# Patient Record
Sex: Female | Born: 1955
Health system: Southern US, Community
[De-identification: ages and names within clinical notes are randomized; demographics above are authoritative.]

## PROBLEM LIST (undated history)

## (undated) DIAGNOSIS — J449 Chronic obstructive pulmonary disease, unspecified: Secondary | ICD-10-CM

## (undated) DIAGNOSIS — I10 Essential (primary) hypertension: Secondary | ICD-10-CM

## (undated) DIAGNOSIS — G4730 Sleep apnea, unspecified: Secondary | ICD-10-CM

## (undated) HISTORY — PX: ABDOMINAL HYSTERECTOMY: SHX81

## (undated) HISTORY — PX: CHOLECYSTECTOMY: SHX55

## (undated) HISTORY — PX: APPENDECTOMY: SHX54

## (undated) HISTORY — PX: TONSILLECTOMY: SUR1361

## (undated) HISTORY — PX: HERNIA REPAIR: SHX51

---

## 2011-06-09 DIAGNOSIS — F331 Major depressive disorder, recurrent, moderate: Secondary | ICD-10-CM | POA: Diagnosis not present

## 2011-08-12 DIAGNOSIS — R51 Headache: Secondary | ICD-10-CM | POA: Diagnosis not present

## 2011-09-08 DIAGNOSIS — M25569 Pain in unspecified knee: Secondary | ICD-10-CM | POA: Diagnosis not present

## 2011-09-08 DIAGNOSIS — E559 Vitamin D deficiency, unspecified: Secondary | ICD-10-CM | POA: Diagnosis not present

## 2011-09-08 DIAGNOSIS — I10 Essential (primary) hypertension: Secondary | ICD-10-CM | POA: Diagnosis not present

## 2011-09-08 DIAGNOSIS — E119 Type 2 diabetes mellitus without complications: Secondary | ICD-10-CM | POA: Diagnosis not present

## 2011-09-08 DIAGNOSIS — F331 Major depressive disorder, recurrent, moderate: Secondary | ICD-10-CM | POA: Diagnosis not present

## 2011-09-08 DIAGNOSIS — E669 Obesity, unspecified: Secondary | ICD-10-CM | POA: Diagnosis not present

## 2011-09-10 DIAGNOSIS — M25569 Pain in unspecified knee: Secondary | ICD-10-CM | POA: Diagnosis not present

## 2011-09-10 DIAGNOSIS — R937 Abnormal findings on diagnostic imaging of other parts of musculoskeletal system: Secondary | ICD-10-CM | POA: Diagnosis not present

## 2011-10-08 DIAGNOSIS — H66009 Acute suppurative otitis media without spontaneous rupture of ear drum, unspecified ear: Secondary | ICD-10-CM | POA: Diagnosis not present

## 2011-10-08 DIAGNOSIS — H1030 Unspecified acute conjunctivitis, unspecified eye: Secondary | ICD-10-CM | POA: Diagnosis not present

## 2011-10-08 DIAGNOSIS — J0100 Acute maxillary sinusitis, unspecified: Secondary | ICD-10-CM | POA: Diagnosis not present

## 2011-11-16 DIAGNOSIS — N318 Other neuromuscular dysfunction of bladder: Secondary | ICD-10-CM | POA: Diagnosis not present

## 2011-11-16 DIAGNOSIS — J40 Bronchitis, not specified as acute or chronic: Secondary | ICD-10-CM | POA: Diagnosis not present

## 2011-11-16 DIAGNOSIS — J309 Allergic rhinitis, unspecified: Secondary | ICD-10-CM | POA: Diagnosis not present

## 2011-11-16 DIAGNOSIS — J45909 Unspecified asthma, uncomplicated: Secondary | ICD-10-CM | POA: Diagnosis not present

## 2011-12-03 DIAGNOSIS — F331 Major depressive disorder, recurrent, moderate: Secondary | ICD-10-CM | POA: Diagnosis not present

## 2011-12-17 DIAGNOSIS — N10 Acute tubulo-interstitial nephritis: Secondary | ICD-10-CM | POA: Diagnosis not present

## 2011-12-17 DIAGNOSIS — N3000 Acute cystitis without hematuria: Secondary | ICD-10-CM | POA: Diagnosis not present

## 2011-12-17 DIAGNOSIS — E119 Type 2 diabetes mellitus without complications: Secondary | ICD-10-CM | POA: Diagnosis not present

## 2012-02-25 DIAGNOSIS — F331 Major depressive disorder, recurrent, moderate: Secondary | ICD-10-CM | POA: Diagnosis not present

## 2012-03-17 DIAGNOSIS — J209 Acute bronchitis, unspecified: Secondary | ICD-10-CM | POA: Diagnosis not present

## 2012-03-26 DIAGNOSIS — J019 Acute sinusitis, unspecified: Secondary | ICD-10-CM | POA: Diagnosis not present

## 2012-03-26 DIAGNOSIS — L0291 Cutaneous abscess, unspecified: Secondary | ICD-10-CM | POA: Diagnosis not present

## 2012-03-26 DIAGNOSIS — L0390 Cellulitis, unspecified: Secondary | ICD-10-CM | POA: Diagnosis not present

## 2012-03-26 DIAGNOSIS — J209 Acute bronchitis, unspecified: Secondary | ICD-10-CM | POA: Diagnosis not present

## 2012-07-12 DIAGNOSIS — F331 Major depressive disorder, recurrent, moderate: Secondary | ICD-10-CM | POA: Diagnosis not present

## 2012-07-18 DIAGNOSIS — L02419 Cutaneous abscess of limb, unspecified: Secondary | ICD-10-CM | POA: Diagnosis not present

## 2012-07-18 DIAGNOSIS — M25569 Pain in unspecified knee: Secondary | ICD-10-CM | POA: Diagnosis not present

## 2012-07-18 DIAGNOSIS — L03119 Cellulitis of unspecified part of limb: Secondary | ICD-10-CM | POA: Diagnosis not present

## 2012-07-18 DIAGNOSIS — E1101 Type 2 diabetes mellitus with hyperosmolarity with coma: Secondary | ICD-10-CM | POA: Diagnosis not present

## 2012-10-20 DIAGNOSIS — N3000 Acute cystitis without hematuria: Secondary | ICD-10-CM | POA: Diagnosis not present

## 2012-10-20 DIAGNOSIS — N390 Urinary tract infection, site not specified: Secondary | ICD-10-CM | POA: Diagnosis not present

## 2012-11-13 DIAGNOSIS — R300 Dysuria: Secondary | ICD-10-CM | POA: Diagnosis not present

## 2012-11-13 DIAGNOSIS — L989 Disorder of the skin and subcutaneous tissue, unspecified: Secondary | ICD-10-CM | POA: Diagnosis not present

## 2012-11-13 DIAGNOSIS — M545 Low back pain: Secondary | ICD-10-CM | POA: Diagnosis not present

## 2012-12-20 DIAGNOSIS — F331 Major depressive disorder, recurrent, moderate: Secondary | ICD-10-CM | POA: Diagnosis not present

## 2013-03-06 DIAGNOSIS — J189 Pneumonia, unspecified organism: Secondary | ICD-10-CM | POA: Diagnosis not present

## 2013-03-06 DIAGNOSIS — R509 Fever, unspecified: Secondary | ICD-10-CM | POA: Diagnosis not present

## 2013-03-06 DIAGNOSIS — N3090 Cystitis, unspecified without hematuria: Secondary | ICD-10-CM | POA: Diagnosis not present

## 2013-03-06 DIAGNOSIS — L0291 Cutaneous abscess, unspecified: Secondary | ICD-10-CM | POA: Diagnosis not present

## 2013-03-06 DIAGNOSIS — N3000 Acute cystitis without hematuria: Secondary | ICD-10-CM | POA: Diagnosis not present

## 2013-03-14 DIAGNOSIS — J189 Pneumonia, unspecified organism: Secondary | ICD-10-CM | POA: Diagnosis not present

## 2013-03-14 DIAGNOSIS — F331 Major depressive disorder, recurrent, moderate: Secondary | ICD-10-CM | POA: Diagnosis not present

## 2013-04-18 DIAGNOSIS — K219 Gastro-esophageal reflux disease without esophagitis: Secondary | ICD-10-CM | POA: Diagnosis not present

## 2013-04-18 DIAGNOSIS — E785 Hyperlipidemia, unspecified: Secondary | ICD-10-CM | POA: Diagnosis not present

## 2013-04-18 DIAGNOSIS — I10 Essential (primary) hypertension: Secondary | ICD-10-CM | POA: Diagnosis not present

## 2013-04-18 DIAGNOSIS — E1149 Type 2 diabetes mellitus with other diabetic neurological complication: Secondary | ICD-10-CM | POA: Diagnosis not present

## 2013-05-11 DIAGNOSIS — J069 Acute upper respiratory infection, unspecified: Secondary | ICD-10-CM | POA: Diagnosis not present

## 2013-05-11 DIAGNOSIS — J209 Acute bronchitis, unspecified: Secondary | ICD-10-CM | POA: Diagnosis not present

## 2013-07-18 DIAGNOSIS — F331 Major depressive disorder, recurrent, moderate: Secondary | ICD-10-CM | POA: Diagnosis not present

## 2013-08-01 DIAGNOSIS — R32 Unspecified urinary incontinence: Secondary | ICD-10-CM | POA: Diagnosis not present

## 2013-08-01 DIAGNOSIS — M549 Dorsalgia, unspecified: Secondary | ICD-10-CM | POA: Diagnosis not present

## 2013-09-29 DIAGNOSIS — J9600 Acute respiratory failure, unspecified whether with hypoxia or hypercapnia: Secondary | ICD-10-CM | POA: Diagnosis not present

## 2013-09-29 DIAGNOSIS — J9620 Acute and chronic respiratory failure, unspecified whether with hypoxia or hypercapnia: Secondary | ICD-10-CM | POA: Diagnosis not present

## 2013-09-29 DIAGNOSIS — R05 Cough: Secondary | ICD-10-CM | POA: Diagnosis not present

## 2013-09-29 DIAGNOSIS — E7800 Pure hypercholesterolemia, unspecified: Secondary | ICD-10-CM | POA: Diagnosis present

## 2013-09-29 DIAGNOSIS — I10 Essential (primary) hypertension: Secondary | ICD-10-CM | POA: Diagnosis present

## 2013-09-29 DIAGNOSIS — J189 Pneumonia, unspecified organism: Secondary | ICD-10-CM | POA: Diagnosis not present

## 2013-09-29 DIAGNOSIS — G4733 Obstructive sleep apnea (adult) (pediatric): Secondary | ICD-10-CM | POA: Diagnosis present

## 2013-09-29 DIAGNOSIS — J45901 Unspecified asthma with (acute) exacerbation: Secondary | ICD-10-CM | POA: Diagnosis not present

## 2013-09-29 DIAGNOSIS — Z6841 Body Mass Index (BMI) 40.0 and over, adult: Secondary | ICD-10-CM | POA: Diagnosis not present

## 2013-09-29 DIAGNOSIS — F329 Major depressive disorder, single episode, unspecified: Secondary | ICD-10-CM | POA: Diagnosis present

## 2013-09-29 DIAGNOSIS — R0602 Shortness of breath: Secondary | ICD-10-CM | POA: Diagnosis not present

## 2013-09-29 DIAGNOSIS — R059 Cough, unspecified: Secondary | ICD-10-CM | POA: Diagnosis not present

## 2013-09-29 DIAGNOSIS — E1149 Type 2 diabetes mellitus with other diabetic neurological complication: Secondary | ICD-10-CM | POA: Diagnosis present

## 2013-09-29 DIAGNOSIS — R0902 Hypoxemia: Secondary | ICD-10-CM | POA: Diagnosis not present

## 2013-09-29 DIAGNOSIS — J440 Chronic obstructive pulmonary disease with acute lower respiratory infection: Secondary | ICD-10-CM | POA: Diagnosis not present

## 2013-09-29 DIAGNOSIS — J441 Chronic obstructive pulmonary disease with (acute) exacerbation: Secondary | ICD-10-CM | POA: Diagnosis not present

## 2013-09-29 DIAGNOSIS — J45909 Unspecified asthma, uncomplicated: Secondary | ICD-10-CM | POA: Diagnosis not present

## 2013-09-29 DIAGNOSIS — J984 Other disorders of lung: Secondary | ICD-10-CM | POA: Diagnosis not present

## 2013-09-29 DIAGNOSIS — F3289 Other specified depressive episodes: Secondary | ICD-10-CM | POA: Diagnosis present

## 2013-09-29 DIAGNOSIS — E662 Morbid (severe) obesity with alveolar hypoventilation: Secondary | ICD-10-CM | POA: Diagnosis not present

## 2013-09-29 DIAGNOSIS — J180 Bronchopneumonia, unspecified organism: Secondary | ICD-10-CM | POA: Diagnosis not present

## 2013-09-29 DIAGNOSIS — Z79899 Other long term (current) drug therapy: Secondary | ICD-10-CM | POA: Diagnosis not present

## 2013-09-29 DIAGNOSIS — K219 Gastro-esophageal reflux disease without esophagitis: Secondary | ICD-10-CM | POA: Diagnosis not present

## 2013-09-29 DIAGNOSIS — J209 Acute bronchitis, unspecified: Secondary | ICD-10-CM | POA: Diagnosis not present

## 2013-09-29 DIAGNOSIS — E1142 Type 2 diabetes mellitus with diabetic polyneuropathy: Secondary | ICD-10-CM | POA: Diagnosis present

## 2013-10-05 DIAGNOSIS — R5381 Other malaise: Secondary | ICD-10-CM | POA: Diagnosis not present

## 2013-10-05 DIAGNOSIS — R0609 Other forms of dyspnea: Secondary | ICD-10-CM | POA: Diagnosis not present

## 2013-10-05 DIAGNOSIS — G4710 Hypersomnia, unspecified: Secondary | ICD-10-CM | POA: Diagnosis not present

## 2013-10-05 DIAGNOSIS — R5383 Other fatigue: Secondary | ICD-10-CM | POA: Diagnosis not present

## 2013-10-05 DIAGNOSIS — G4730 Sleep apnea, unspecified: Secondary | ICD-10-CM | POA: Diagnosis not present

## 2013-10-05 DIAGNOSIS — J309 Allergic rhinitis, unspecified: Secondary | ICD-10-CM | POA: Diagnosis not present

## 2013-10-05 DIAGNOSIS — E559 Vitamin D deficiency, unspecified: Secondary | ICD-10-CM | POA: Diagnosis not present

## 2013-10-10 DIAGNOSIS — F331 Major depressive disorder, recurrent, moderate: Secondary | ICD-10-CM | POA: Diagnosis not present

## 2013-10-19 DIAGNOSIS — M545 Low back pain, unspecified: Secondary | ICD-10-CM | POA: Diagnosis not present

## 2013-10-19 DIAGNOSIS — G4733 Obstructive sleep apnea (adult) (pediatric): Secondary | ICD-10-CM | POA: Diagnosis not present

## 2013-10-19 DIAGNOSIS — J45909 Unspecified asthma, uncomplicated: Secondary | ICD-10-CM | POA: Diagnosis not present

## 2013-10-28 DIAGNOSIS — K047 Periapical abscess without sinus: Secondary | ICD-10-CM | POA: Diagnosis not present

## 2013-10-28 DIAGNOSIS — F17200 Nicotine dependence, unspecified, uncomplicated: Secondary | ICD-10-CM | POA: Diagnosis not present

## 2013-10-28 DIAGNOSIS — I10 Essential (primary) hypertension: Secondary | ICD-10-CM | POA: Diagnosis not present

## 2013-10-28 DIAGNOSIS — E119 Type 2 diabetes mellitus without complications: Secondary | ICD-10-CM | POA: Diagnosis not present

## 2013-10-28 DIAGNOSIS — K089 Disorder of teeth and supporting structures, unspecified: Secondary | ICD-10-CM | POA: Diagnosis not present

## 2013-10-28 DIAGNOSIS — K05219 Aggressive periodontitis, localized, unspecified severity: Secondary | ICD-10-CM | POA: Diagnosis not present

## 2013-10-28 DIAGNOSIS — Z79899 Other long term (current) drug therapy: Secondary | ICD-10-CM | POA: Diagnosis not present

## 2013-11-01 DIAGNOSIS — J9620 Acute and chronic respiratory failure, unspecified whether with hypoxia or hypercapnia: Secondary | ICD-10-CM | POA: Diagnosis not present

## 2014-01-30 DIAGNOSIS — F331 Major depressive disorder, recurrent, moderate: Secondary | ICD-10-CM | POA: Diagnosis not present

## 2014-02-15 DIAGNOSIS — N3281 Overactive bladder: Secondary | ICD-10-CM | POA: Diagnosis not present

## 2014-02-15 DIAGNOSIS — I10 Essential (primary) hypertension: Secondary | ICD-10-CM | POA: Diagnosis not present

## 2014-02-15 DIAGNOSIS — L03115 Cellulitis of right lower limb: Secondary | ICD-10-CM | POA: Diagnosis not present

## 2014-02-15 DIAGNOSIS — M25561 Pain in right knee: Secondary | ICD-10-CM | POA: Diagnosis not present

## 2014-02-15 DIAGNOSIS — Z23 Encounter for immunization: Secondary | ICD-10-CM | POA: Diagnosis not present

## 2014-02-15 DIAGNOSIS — R32 Unspecified urinary incontinence: Secondary | ICD-10-CM | POA: Diagnosis not present

## 2014-02-15 DIAGNOSIS — E1140 Type 2 diabetes mellitus with diabetic neuropathy, unspecified: Secondary | ICD-10-CM | POA: Diagnosis not present

## 2014-02-15 DIAGNOSIS — E785 Hyperlipidemia, unspecified: Secondary | ICD-10-CM | POA: Diagnosis not present

## 2014-02-15 DIAGNOSIS — S8991XA Unspecified injury of right lower leg, initial encounter: Secondary | ICD-10-CM | POA: Diagnosis not present

## 2014-02-22 DIAGNOSIS — R0602 Shortness of breath: Secondary | ICD-10-CM | POA: Diagnosis not present

## 2014-02-22 DIAGNOSIS — Z720 Tobacco use: Secondary | ICD-10-CM | POA: Diagnosis not present

## 2014-02-22 DIAGNOSIS — R42 Dizziness and giddiness: Secondary | ICD-10-CM | POA: Diagnosis not present

## 2014-02-22 DIAGNOSIS — L03115 Cellulitis of right lower limb: Secondary | ICD-10-CM | POA: Diagnosis not present

## 2014-02-22 DIAGNOSIS — R2241 Localized swelling, mass and lump, right lower limb: Secondary | ICD-10-CM | POA: Diagnosis not present

## 2014-02-22 DIAGNOSIS — Z792 Long term (current) use of antibiotics: Secondary | ICD-10-CM | POA: Diagnosis not present

## 2014-02-22 DIAGNOSIS — S0990XA Unspecified injury of head, initial encounter: Secondary | ICD-10-CM | POA: Diagnosis not present

## 2014-02-22 DIAGNOSIS — J45909 Unspecified asthma, uncomplicated: Secondary | ICD-10-CM | POA: Diagnosis not present

## 2014-02-22 DIAGNOSIS — E780 Pure hypercholesterolemia: Secondary | ICD-10-CM | POA: Diagnosis not present

## 2014-02-22 DIAGNOSIS — J189 Pneumonia, unspecified organism: Secondary | ICD-10-CM | POA: Diagnosis not present

## 2014-02-22 DIAGNOSIS — R918 Other nonspecific abnormal finding of lung field: Secondary | ICD-10-CM | POA: Diagnosis not present

## 2014-02-22 DIAGNOSIS — E118 Type 2 diabetes mellitus with unspecified complications: Secondary | ICD-10-CM | POA: Diagnosis not present

## 2014-02-22 DIAGNOSIS — M79604 Pain in right leg: Secondary | ICD-10-CM | POA: Diagnosis not present

## 2014-02-22 DIAGNOSIS — I10 Essential (primary) hypertension: Secondary | ICD-10-CM | POA: Diagnosis not present

## 2014-03-26 DIAGNOSIS — N3281 Overactive bladder: Secondary | ICD-10-CM | POA: Diagnosis not present

## 2014-03-26 DIAGNOSIS — N3090 Cystitis, unspecified without hematuria: Secondary | ICD-10-CM | POA: Diagnosis not present

## 2014-03-30 DIAGNOSIS — R918 Other nonspecific abnormal finding of lung field: Secondary | ICD-10-CM | POA: Diagnosis not present

## 2014-03-30 DIAGNOSIS — R509 Fever, unspecified: Secondary | ICD-10-CM | POA: Diagnosis not present

## 2014-03-30 DIAGNOSIS — J189 Pneumonia, unspecified organism: Secondary | ICD-10-CM | POA: Diagnosis not present

## 2014-03-30 DIAGNOSIS — N390 Urinary tract infection, site not specified: Secondary | ICD-10-CM | POA: Diagnosis not present

## 2014-03-30 DIAGNOSIS — J9601 Acute respiratory failure with hypoxia: Secondary | ICD-10-CM | POA: Diagnosis not present

## 2014-03-30 DIAGNOSIS — R0989 Other specified symptoms and signs involving the circulatory and respiratory systems: Secondary | ICD-10-CM | POA: Diagnosis not present

## 2014-03-31 DIAGNOSIS — Z885 Allergy status to narcotic agent status: Secondary | ICD-10-CM | POA: Diagnosis not present

## 2014-03-31 DIAGNOSIS — R51 Headache: Secondary | ICD-10-CM | POA: Diagnosis present

## 2014-03-31 DIAGNOSIS — J9600 Acute respiratory failure, unspecified whether with hypoxia or hypercapnia: Secondary | ICD-10-CM | POA: Diagnosis not present

## 2014-03-31 DIAGNOSIS — I10 Essential (primary) hypertension: Secondary | ICD-10-CM | POA: Diagnosis present

## 2014-03-31 DIAGNOSIS — J9601 Acute respiratory failure with hypoxia: Secondary | ICD-10-CM | POA: Diagnosis not present

## 2014-03-31 DIAGNOSIS — R0989 Other specified symptoms and signs involving the circulatory and respiratory systems: Secondary | ICD-10-CM | POA: Diagnosis not present

## 2014-03-31 DIAGNOSIS — N390 Urinary tract infection, site not specified: Secondary | ICD-10-CM | POA: Diagnosis not present

## 2014-03-31 DIAGNOSIS — J189 Pneumonia, unspecified organism: Secondary | ICD-10-CM | POA: Diagnosis not present

## 2014-03-31 DIAGNOSIS — F17200 Nicotine dependence, unspecified, uncomplicated: Secondary | ICD-10-CM | POA: Diagnosis present

## 2014-03-31 DIAGNOSIS — K219 Gastro-esophageal reflux disease without esophagitis: Secondary | ICD-10-CM | POA: Diagnosis present

## 2014-03-31 DIAGNOSIS — D649 Anemia, unspecified: Secondary | ICD-10-CM | POA: Diagnosis present

## 2014-03-31 DIAGNOSIS — E780 Pure hypercholesterolemia: Secondary | ICD-10-CM | POA: Diagnosis present

## 2014-03-31 DIAGNOSIS — B9620 Unspecified Escherichia coli [E. coli] as the cause of diseases classified elsewhere: Secondary | ICD-10-CM | POA: Diagnosis present

## 2014-03-31 DIAGNOSIS — R509 Fever, unspecified: Secondary | ICD-10-CM | POA: Diagnosis not present

## 2014-03-31 DIAGNOSIS — E1140 Type 2 diabetes mellitus with diabetic neuropathy, unspecified: Secondary | ICD-10-CM | POA: Diagnosis not present

## 2014-03-31 DIAGNOSIS — G4733 Obstructive sleep apnea (adult) (pediatric): Secondary | ICD-10-CM | POA: Diagnosis present

## 2014-03-31 DIAGNOSIS — J45901 Unspecified asthma with (acute) exacerbation: Secondary | ICD-10-CM | POA: Diagnosis not present

## 2014-03-31 DIAGNOSIS — R918 Other nonspecific abnormal finding of lung field: Secondary | ICD-10-CM | POA: Diagnosis not present

## 2014-03-31 DIAGNOSIS — F419 Anxiety disorder, unspecified: Secondary | ICD-10-CM | POA: Diagnosis present

## 2014-03-31 DIAGNOSIS — J441 Chronic obstructive pulmonary disease with (acute) exacerbation: Secondary | ICD-10-CM | POA: Diagnosis not present

## 2014-03-31 DIAGNOSIS — J168 Pneumonia due to other specified infectious organisms: Secondary | ICD-10-CM | POA: Diagnosis not present

## 2014-03-31 DIAGNOSIS — M1990 Unspecified osteoarthritis, unspecified site: Secondary | ICD-10-CM | POA: Diagnosis present

## 2014-03-31 DIAGNOSIS — F319 Bipolar disorder, unspecified: Secondary | ICD-10-CM | POA: Diagnosis present

## 2014-03-31 DIAGNOSIS — Z881 Allergy status to other antibiotic agents status: Secondary | ICD-10-CM | POA: Diagnosis not present

## 2014-03-31 DIAGNOSIS — Z91040 Latex allergy status: Secondary | ICD-10-CM | POA: Diagnosis not present

## 2014-04-18 DIAGNOSIS — Z7952 Long term (current) use of systemic steroids: Secondary | ICD-10-CM | POA: Diagnosis not present

## 2014-04-18 DIAGNOSIS — Z720 Tobacco use: Secondary | ICD-10-CM | POA: Diagnosis not present

## 2014-04-18 DIAGNOSIS — I10 Essential (primary) hypertension: Secondary | ICD-10-CM | POA: Diagnosis not present

## 2014-04-18 DIAGNOSIS — R0789 Other chest pain: Secondary | ICD-10-CM | POA: Diagnosis not present

## 2014-04-18 DIAGNOSIS — R531 Weakness: Secondary | ICD-10-CM | POA: Diagnosis not present

## 2014-04-18 DIAGNOSIS — J441 Chronic obstructive pulmonary disease with (acute) exacerbation: Secondary | ICD-10-CM | POA: Diagnosis not present

## 2014-04-18 DIAGNOSIS — J45909 Unspecified asthma, uncomplicated: Secondary | ICD-10-CM | POA: Diagnosis not present

## 2014-04-18 DIAGNOSIS — R079 Chest pain, unspecified: Secondary | ICD-10-CM | POA: Diagnosis not present

## 2014-04-18 DIAGNOSIS — J45901 Unspecified asthma with (acute) exacerbation: Secondary | ICD-10-CM | POA: Diagnosis not present

## 2014-04-18 DIAGNOSIS — E780 Pure hypercholesterolemia: Secondary | ICD-10-CM | POA: Diagnosis not present

## 2014-04-18 DIAGNOSIS — R05 Cough: Secondary | ICD-10-CM | POA: Diagnosis not present

## 2014-04-18 DIAGNOSIS — E119 Type 2 diabetes mellitus without complications: Secondary | ICD-10-CM | POA: Diagnosis not present

## 2014-05-30 DIAGNOSIS — F331 Major depressive disorder, recurrent, moderate: Secondary | ICD-10-CM | POA: Diagnosis not present

## 2014-06-08 DIAGNOSIS — I10 Essential (primary) hypertension: Secondary | ICD-10-CM | POA: Diagnosis not present

## 2014-06-08 DIAGNOSIS — R05 Cough: Secondary | ICD-10-CM | POA: Diagnosis not present

## 2014-06-08 DIAGNOSIS — N390 Urinary tract infection, site not specified: Secondary | ICD-10-CM | POA: Diagnosis not present

## 2014-06-08 DIAGNOSIS — E118 Type 2 diabetes mellitus with unspecified complications: Secondary | ICD-10-CM | POA: Diagnosis not present

## 2014-06-08 DIAGNOSIS — R079 Chest pain, unspecified: Secondary | ICD-10-CM | POA: Diagnosis not present

## 2014-06-08 DIAGNOSIS — R0602 Shortness of breath: Secondary | ICD-10-CM | POA: Diagnosis not present

## 2014-06-08 DIAGNOSIS — J45909 Unspecified asthma, uncomplicated: Secondary | ICD-10-CM | POA: Diagnosis not present

## 2014-06-08 DIAGNOSIS — E780 Pure hypercholesterolemia: Secondary | ICD-10-CM | POA: Diagnosis not present

## 2014-07-23 DIAGNOSIS — R079 Chest pain, unspecified: Secondary | ICD-10-CM | POA: Diagnosis not present

## 2014-07-23 DIAGNOSIS — E780 Pure hypercholesterolemia: Secondary | ICD-10-CM | POA: Diagnosis not present

## 2014-07-23 DIAGNOSIS — G43909 Migraine, unspecified, not intractable, without status migrainosus: Secondary | ICD-10-CM | POA: Diagnosis not present

## 2014-07-23 DIAGNOSIS — E119 Type 2 diabetes mellitus without complications: Secondary | ICD-10-CM | POA: Diagnosis not present

## 2014-07-23 DIAGNOSIS — G44209 Tension-type headache, unspecified, not intractable: Secondary | ICD-10-CM | POA: Diagnosis not present

## 2014-07-23 DIAGNOSIS — R0789 Other chest pain: Secondary | ICD-10-CM | POA: Diagnosis not present

## 2014-07-23 DIAGNOSIS — J209 Acute bronchitis, unspecified: Secondary | ICD-10-CM | POA: Diagnosis not present

## 2014-07-23 DIAGNOSIS — I10 Essential (primary) hypertension: Secondary | ICD-10-CM | POA: Diagnosis not present

## 2014-07-23 DIAGNOSIS — J45909 Unspecified asthma, uncomplicated: Secondary | ICD-10-CM | POA: Diagnosis not present

## 2014-08-22 DIAGNOSIS — F331 Major depressive disorder, recurrent, moderate: Secondary | ICD-10-CM | POA: Diagnosis not present

## 2014-09-24 ENCOUNTER — Encounter: Payer: Self-pay | Admitting: Emergency Medicine

## 2014-09-24 ENCOUNTER — Emergency Department
Admission: EM | Admit: 2014-09-24 | Discharge: 2014-09-24 | Disposition: A | Discharge status: Home / Self Care | Payer: Medicare Other | Attending: Emergency Medicine | Admitting: Emergency Medicine

## 2014-09-24 ENCOUNTER — Emergency Department: Payer: Medicare Other

## 2014-09-24 ENCOUNTER — Other Ambulatory Visit: Payer: Self-pay

## 2014-09-24 DIAGNOSIS — Z880 Allergy status to penicillin: Secondary | ICD-10-CM | POA: Diagnosis not present

## 2014-09-24 DIAGNOSIS — R609 Edema, unspecified: Secondary | ICD-10-CM | POA: Diagnosis not present

## 2014-09-24 DIAGNOSIS — R0789 Other chest pain: Secondary | ICD-10-CM | POA: Diagnosis not present

## 2014-09-24 DIAGNOSIS — I10 Essential (primary) hypertension: Secondary | ICD-10-CM | POA: Insufficient documentation

## 2014-09-24 DIAGNOSIS — Z91040 Latex allergy status: Secondary | ICD-10-CM | POA: Insufficient documentation

## 2014-09-24 DIAGNOSIS — J441 Chronic obstructive pulmonary disease with (acute) exacerbation: Secondary | ICD-10-CM | POA: Diagnosis not present

## 2014-09-24 DIAGNOSIS — R05 Cough: Secondary | ICD-10-CM | POA: Diagnosis not present

## 2014-09-24 DIAGNOSIS — R2243 Localized swelling, mass and lump, lower limb, bilateral: Secondary | ICD-10-CM | POA: Diagnosis present

## 2014-09-24 DIAGNOSIS — R600 Localized edema: Secondary | ICD-10-CM | POA: Diagnosis not present

## 2014-09-24 HISTORY — DX: Essential (primary) hypertension: I10

## 2014-09-24 HISTORY — DX: Sleep apnea, unspecified: G47.30

## 2014-09-24 HISTORY — DX: Chronic obstructive pulmonary disease, unspecified: J44.9

## 2014-09-24 LAB — CBC WITH DIFFERENTIAL/PLATELET
BASOS ABS: 0 10*3/uL (ref 0–0.1)
Basophils Relative: 1 %
Eosinophils Absolute: 0.1 10*3/uL (ref 0–0.7)
Eosinophils Relative: 2 %
HCT: 40.8 % (ref 35.0–47.0)
Hemoglobin: 13.3 g/dL (ref 12.0–16.0)
LYMPHS ABS: 1.1 10*3/uL (ref 1.0–3.6)
LYMPHS PCT: 21 %
MCH: 28.8 pg (ref 26.0–34.0)
MCHC: 32.5 g/dL (ref 32.0–36.0)
MCV: 88.7 fL (ref 80.0–100.0)
MONOS PCT: 6 %
Monocytes Absolute: 0.3 10*3/uL (ref 0.2–0.9)
NEUTROS PCT: 70 %
Neutro Abs: 3.6 10*3/uL (ref 1.4–6.5)
Platelets: 108 10*3/uL — ABNORMAL LOW (ref 150–440)
RBC: 4.6 MIL/uL (ref 3.80–5.20)
RDW: 14.1 % (ref 11.5–14.5)
WBC: 5.1 10*3/uL (ref 3.6–11.0)

## 2014-09-24 LAB — BASIC METABOLIC PANEL
Anion gap: 3 — ABNORMAL LOW (ref 5–15)
BUN: 18 mg/dL (ref 6–20)
CHLORIDE: 107 mmol/L (ref 101–111)
CO2: 31 mmol/L (ref 22–32)
Calcium: 9 mg/dL (ref 8.9–10.3)
Creatinine, Ser: 0.93 mg/dL (ref 0.44–1.00)
GFR calc Af Amer: >60 mL/min (ref >60)
GFR calc non Af Amer: >60 mL/min (ref >60)
Glucose, Bld: 139 mg/dL — ABNORMAL HIGH (ref 65–99)
Potassium: 4.4 mmol/L (ref 3.5–5.1)
Sodium: 141 mmol/L (ref 135–145)

## 2014-09-24 LAB — TROPONIN I: Troponin I: <0.03 ng/mL (ref <0.031)

## 2014-09-24 MED ORDER — CEPHALEXIN 500 MG PO CAPS: 500.0000 mg | ORAL_CAPSULE | Freq: Three times a day (TID) | ORAL | Status: AC

## 2014-09-24 MED ORDER — FUROSEMIDE 40 MG PO TABS: 40.0000 mg | ORAL_TABLET | Freq: Two times a day (BID) | ORAL | Status: AC

## 2014-09-24 MED ORDER — TRAMADOL HCL 50 MG PO TABS: 50.0000 mg | ORAL_TABLET | Freq: Four times a day (QID) | ORAL | Status: AC | PRN

## 2014-09-24 NOTE — Discharge Instructions (Signed)
Peripheral Edema You have swelling in your legs (peripheral edema). This swelling is due to excess accumulation of salt and water in your body. Edema may be a sign of heart, kidney or liver disease, or a side effect of a medication. It may also be due to problems in the leg veins. Elevating your legs and using special support stockings may be very helpful, if the cause of the swelling is due to poor venous circulation. Avoid long periods of standing, whatever the cause. Treatment of edema depends on identifying the cause. Chips, pretzels, pickles and other salty foods should be avoided. Restricting salt in your diet is almost always needed. Water pills (diuretics) are often used to remove the excess salt and water from your body via urine. These medicines prevent the kidney from reabsorbing sodium. This increases urine flow. Diuretic treatment may also result in lowering of potassium levels in your body. Potassium supplements may be needed if you have to use diuretics daily. Daily weights can help you keep track of your progress in clearing your edema. You should call your caregiver for follow up care as recommended. SEEK IMMEDIATE MEDICAL CARE IF:   You have increased swelling, pain, redness, or heat in your legs.  You develop shortness of breath, especially when lying down.  You develop chest or abdominal pain, weakness, or fainting.  You have a fever. Document Released: 05/06/2004 Document Revised: 06/21/2011 Document Reviewed: 04/16/2009 [facility] Patient Information 2015 [place], [place]. This information is not intended to replace advice given to you by your health care provider. Make sure you discuss any questions you have with your health care provider.    As we have discussed please follow-up with your primary care doctor at the end of this week. Please take your Lasix as prescribed for the next 5 days. Please take your antibiotics as prescribed. Return to the emergency department for  worsening pain, any chest pain, trouble breathing, or any other personally concerning to yourself.

## 2014-09-24 NOTE — ED Provider Notes (Signed)
[facility] Emergency Department Provider Note  Time seen: 5:33 PM  I have reviewed the triage vital signs and the nursing notes.   HISTORY  Chief Complaint Leg Swelling    HPI Peggy Ferguson is a 59 y.o. female with a past medical history of COPD, hypertension, sleep apnea, peripheral edema resists the emergency department with increased leg swelling and weeping for the past 5 days. According to the patient for the past 5 days she has been having increased lower extremity swelling, and pain with ambulation. She notes now that her legs have began weeping. On review of systems the patient also states she has been having intermittent chest pain and shortness of breath. Denies any cough or congestion. Denies any fever. Patient takes Lasix (40 mg daily) at baseline. Patient states her chest pain has been mild, intermittent, no radiation. Denies nausea or diaphoresis. Patient states the leg pain is moderate, dull/aching, somewhat worse with ambulation.    Past Medical History  Diagnosis Date  . COPD (chronic obstructive pulmonary disease)   . Hypertension   . Sleep apnea     There are no active problems to display for this patient.   Past Surgical History  Procedure Laterality Date  . Cholecystectomy    . Hernia repair    . Appendectomy    . Abdominal hysterectomy    . Tonsillectomy      No current outpatient prescriptions on file.  Allergies Latex; Metformin and related; and Penicillins  No family history on file.  Social History History  Substance Use Topics  . Smoking status: Never Smoker   . Smokeless tobacco: Not on file  . Alcohol Use: No    Review of Systems Constitutional: Negative for fever. Cardiovascular: Positive for intermittent chest pain. Respiratory: Negative for shortness of breath. Gastrointestinal: Negative for abdominal pain Musculoskeletal: Negative for back pain. Neurological: Negative for headache 10-point ROS  otherwise negative.  ____________________________________________   PHYSICAL EXAM:  VITAL SIGNS: ED Triage Vitals  Enc Vitals Group     BP 09/24/14 1531 139/78 mmHg     Pulse Rate 09/24/14 1531 61     Resp 09/24/14 1531 20     Temp 09/24/14 1531 98.5 F (36.9 C)     Temp Source 09/24/14 1531 Oral     SpO2 09/24/14 1531 95 %     Weight 09/24/14 1531 335 lb (151.955 kg)     Height 09/24/14 1531  (1.676 m)     Head Cir --      Peak Flow --      Pain Score 09/24/14 1531 9     Pain Loc --      Pain Edu? --      Excl. in GC? --     Constitutional: Alert and oriented. Well appearing and in no distress. ENT   Head: Normocephalic and atraumatic.   Mouth/Throat: Mucous membranes are moist. Cardiovascular: Normal rate, regular rhythm. No murmur Respiratory: Normal respiratory effort without tachypnea nor retractions. Breath sounds are clear and equal bilaterally. No wheezes/rales/rhonchi. Gastrointestinal: Soft and nontender. No distention. Musculoskeletal: Normal range of motion in lower extremities. Patient does have mild weeping of the left lower extremity. Minimal erythema. Mild but equal tenderness. Equal edema bilaterally. Neurologic:  Normal speech and language. No gross focal neurologic deficits Skin:  Skin is warm, dry, mild erythema and weeping as stated above. Psychiatric: Mood and affect are normal. Speech and behavior are normal.  ____________________________________________    EKG  EKG reviewed  and interpreted by myself shows normal sinus rhythm at 62 bpm, narrow QRS, normal axis, normal intervals, normal ST segments. Overall normal EKG.  ____________________________________________    RADIOLOGY  Chest x-ray within normal limits  ____________________________________________   INITIAL IMPRESSION / ASSESSMENT AND PLAN / ED COURSE  Pertinent labs & imaging results that were available during my care of the patient were reviewed by me and considered  in my medical decision making (see chart for details).  Patient with 5 days of increased lower extremity edema, now with some mild weeping. Patient has a history of peripheral edema which she takes Lasix daily. The patient states intermittent chest pain, but this is chronic. Denies fever. Possible mild cellulitis in lower extremities. We will check labs including troponin, chest x-ray, and closely monitor in the emergency department while awaiting results.   Chest x-ray is normal, EKG appears well, labs are within normal limits. I discussed results with the patient. We will place the patient on Keflex, double her Lasix for 5 days, and Ultram as needed for pain. The patient is follow up with her primary care doctor at the end of this week. The patient is agreeable to this plan. Discussed strict return precautions which she is agreeable. ____________________________________________   FINAL CLINICAL IMPRESSION(S) / ED DIAGNOSES  Peripheral edema   Minna Antis, MD 09/24/14 1902

## 2014-09-24 NOTE — ED Notes (Signed)
Pt at xray at this time.

## 2014-09-24 NOTE — ED Notes (Signed)
Pt with bilateral leg swelling and redness for four days. Weeping noted to left lower leg in triage.

## 2014-09-24 NOTE — ED Notes (Signed)
MD Paduchowski at bedside at this time  

## 2014-11-03 DIAGNOSIS — R079 Chest pain, unspecified: Secondary | ICD-10-CM | POA: Diagnosis not present

## 2014-11-03 DIAGNOSIS — R0602 Shortness of breath: Secondary | ICD-10-CM | POA: Diagnosis not present

## 2014-11-03 DIAGNOSIS — N390 Urinary tract infection, site not specified: Secondary | ICD-10-CM | POA: Diagnosis not present

## 2014-11-03 DIAGNOSIS — N3000 Acute cystitis without hematuria: Secondary | ICD-10-CM | POA: Diagnosis not present

## 2014-11-03 DIAGNOSIS — R091 Pleurisy: Secondary | ICD-10-CM | POA: Diagnosis not present

## 2015-01-12 DIAGNOSIS — J449 Chronic obstructive pulmonary disease, unspecified: Secondary | ICD-10-CM | POA: Diagnosis not present

## 2015-01-12 DIAGNOSIS — R0789 Other chest pain: Secondary | ICD-10-CM | POA: Diagnosis not present

## 2015-01-12 DIAGNOSIS — R509 Fever, unspecified: Secondary | ICD-10-CM | POA: Diagnosis not present

## 2015-01-12 DIAGNOSIS — R0602 Shortness of breath: Secondary | ICD-10-CM | POA: Diagnosis not present

## 2015-01-19 DIAGNOSIS — F319 Bipolar disorder, unspecified: Secondary | ICD-10-CM | POA: Diagnosis not present

## 2015-01-19 DIAGNOSIS — I10 Essential (primary) hypertension: Secondary | ICD-10-CM | POA: Diagnosis not present

## 2015-01-19 DIAGNOSIS — Z87891 Personal history of nicotine dependence: Secondary | ICD-10-CM | POA: Diagnosis not present

## 2015-01-19 DIAGNOSIS — R0602 Shortness of breath: Secondary | ICD-10-CM | POA: Diagnosis not present

## 2015-01-19 DIAGNOSIS — J189 Pneumonia, unspecified organism: Secondary | ICD-10-CM | POA: Diagnosis not present

## 2015-01-19 DIAGNOSIS — E7800 Pure hypercholesterolemia, unspecified: Secondary | ICD-10-CM | POA: Diagnosis not present

## 2015-01-19 DIAGNOSIS — R0789 Other chest pain: Secondary | ICD-10-CM | POA: Diagnosis not present

## 2015-01-19 DIAGNOSIS — F419 Anxiety disorder, unspecified: Secondary | ICD-10-CM | POA: Diagnosis not present

## 2015-01-19 DIAGNOSIS — J45909 Unspecified asthma, uncomplicated: Secondary | ICD-10-CM | POA: Diagnosis not present

## 2015-01-19 DIAGNOSIS — Z9114 Patient's other noncompliance with medication regimen: Secondary | ICD-10-CM | POA: Diagnosis not present

## 2015-01-19 DIAGNOSIS — M1990 Unspecified osteoarthritis, unspecified site: Secondary | ICD-10-CM | POA: Diagnosis not present

## 2015-01-20 DIAGNOSIS — G4733 Obstructive sleep apnea (adult) (pediatric): Secondary | ICD-10-CM | POA: Diagnosis not present

## 2015-01-20 DIAGNOSIS — E559 Vitamin D deficiency, unspecified: Secondary | ICD-10-CM | POA: Diagnosis not present

## 2015-01-20 DIAGNOSIS — J309 Allergic rhinitis, unspecified: Secondary | ICD-10-CM | POA: Diagnosis not present

## 2015-01-20 DIAGNOSIS — R0600 Dyspnea, unspecified: Secondary | ICD-10-CM | POA: Diagnosis not present

## 2015-01-20 DIAGNOSIS — R5383 Other fatigue: Secondary | ICD-10-CM | POA: Diagnosis not present

## 2015-01-22 DIAGNOSIS — F331 Major depressive disorder, recurrent, moderate: Secondary | ICD-10-CM | POA: Diagnosis not present

## 2015-01-23 DIAGNOSIS — N3000 Acute cystitis without hematuria: Secondary | ICD-10-CM | POA: Diagnosis not present

## 2015-01-23 DIAGNOSIS — N318 Other neuromuscular dysfunction of bladder: Secondary | ICD-10-CM | POA: Diagnosis not present

## 2015-01-23 DIAGNOSIS — N3946 Mixed incontinence: Secondary | ICD-10-CM | POA: Diagnosis not present

## 2015-01-24 DIAGNOSIS — N3946 Mixed incontinence: Secondary | ICD-10-CM | POA: Diagnosis not present

## 2015-01-27 DIAGNOSIS — N393 Stress incontinence (female) (male): Secondary | ICD-10-CM | POA: Diagnosis not present

## 2015-01-27 DIAGNOSIS — N3946 Mixed incontinence: Secondary | ICD-10-CM | POA: Diagnosis not present

## 2015-01-27 DIAGNOSIS — N3020 Other chronic cystitis without hematuria: Secondary | ICD-10-CM | POA: Diagnosis not present

## 2015-01-27 DIAGNOSIS — N318 Other neuromuscular dysfunction of bladder: Secondary | ICD-10-CM | POA: Diagnosis not present

## 2015-01-27 DIAGNOSIS — E669 Obesity, unspecified: Secondary | ICD-10-CM | POA: Diagnosis not present

## 2015-02-05 DIAGNOSIS — R5383 Other fatigue: Secondary | ICD-10-CM | POA: Diagnosis not present

## 2015-02-05 DIAGNOSIS — R0600 Dyspnea, unspecified: Secondary | ICD-10-CM | POA: Diagnosis not present

## 2015-02-05 DIAGNOSIS — J309 Allergic rhinitis, unspecified: Secondary | ICD-10-CM | POA: Diagnosis not present

## 2015-02-05 DIAGNOSIS — G4733 Obstructive sleep apnea (adult) (pediatric): Secondary | ICD-10-CM | POA: Diagnosis not present

## 2015-02-07 DIAGNOSIS — G4733 Obstructive sleep apnea (adult) (pediatric): Secondary | ICD-10-CM | POA: Diagnosis not present

## 2015-02-13 DIAGNOSIS — R0600 Dyspnea, unspecified: Secondary | ICD-10-CM | POA: Diagnosis not present

## 2015-02-13 DIAGNOSIS — G4733 Obstructive sleep apnea (adult) (pediatric): Secondary | ICD-10-CM | POA: Diagnosis not present

## 2015-02-13 DIAGNOSIS — R5383 Other fatigue: Secondary | ICD-10-CM | POA: Diagnosis not present

## 2015-02-13 DIAGNOSIS — J309 Allergic rhinitis, unspecified: Secondary | ICD-10-CM | POA: Diagnosis not present

## 2015-02-19 DIAGNOSIS — N393 Stress incontinence (female) (male): Secondary | ICD-10-CM | POA: Diagnosis not present

## 2015-02-19 DIAGNOSIS — M545 Low back pain: Secondary | ICD-10-CM | POA: Diagnosis not present

## 2015-02-19 DIAGNOSIS — N3020 Other chronic cystitis without hematuria: Secondary | ICD-10-CM | POA: Diagnosis not present

## 2015-02-19 DIAGNOSIS — Z23 Encounter for immunization: Secondary | ICD-10-CM | POA: Diagnosis not present

## 2015-02-19 DIAGNOSIS — N318 Other neuromuscular dysfunction of bladder: Secondary | ICD-10-CM | POA: Diagnosis not present

## 2015-02-19 DIAGNOSIS — M5416 Radiculopathy, lumbar region: Secondary | ICD-10-CM | POA: Diagnosis not present

## 2015-03-26 DIAGNOSIS — J189 Pneumonia, unspecified organism: Secondary | ICD-10-CM | POA: Diagnosis not present

## 2015-03-28 DIAGNOSIS — R5383 Other fatigue: Secondary | ICD-10-CM | POA: Diagnosis not present

## 2015-03-28 DIAGNOSIS — N3941 Urge incontinence: Secondary | ICD-10-CM | POA: Diagnosis not present

## 2015-03-28 DIAGNOSIS — R152 Fecal urgency: Secondary | ICD-10-CM | POA: Diagnosis not present

## 2015-03-28 DIAGNOSIS — R0600 Dyspnea, unspecified: Secondary | ICD-10-CM | POA: Diagnosis not present

## 2015-03-28 DIAGNOSIS — G4733 Obstructive sleep apnea (adult) (pediatric): Secondary | ICD-10-CM | POA: Diagnosis not present

## 2015-03-28 DIAGNOSIS — J4540 Moderate persistent asthma, uncomplicated: Secondary | ICD-10-CM | POA: Diagnosis not present

## 2015-03-28 DIAGNOSIS — M6281 Muscle weakness (generalized): Secondary | ICD-10-CM | POA: Diagnosis not present

## 2015-03-28 DIAGNOSIS — J301 Allergic rhinitis due to pollen: Secondary | ICD-10-CM | POA: Diagnosis not present

## 2015-04-08 DIAGNOSIS — R5383 Other fatigue: Secondary | ICD-10-CM | POA: Diagnosis not present

## 2015-04-08 DIAGNOSIS — J309 Allergic rhinitis, unspecified: Secondary | ICD-10-CM | POA: Diagnosis not present

## 2015-04-08 DIAGNOSIS — G4733 Obstructive sleep apnea (adult) (pediatric): Secondary | ICD-10-CM | POA: Diagnosis not present

## 2015-04-08 DIAGNOSIS — J4541 Moderate persistent asthma with (acute) exacerbation: Secondary | ICD-10-CM | POA: Diagnosis not present

## 2015-04-15 DIAGNOSIS — R32 Unspecified urinary incontinence: Secondary | ICD-10-CM | POA: Diagnosis not present

## 2015-04-15 DIAGNOSIS — G4733 Obstructive sleep apnea (adult) (pediatric): Secondary | ICD-10-CM | POA: Diagnosis not present

## 2015-04-15 DIAGNOSIS — E1142 Type 2 diabetes mellitus with diabetic polyneuropathy: Secondary | ICD-10-CM | POA: Diagnosis not present

## 2015-04-15 DIAGNOSIS — Z6841 Body Mass Index (BMI) 40.0 and over, adult: Secondary | ICD-10-CM | POA: Diagnosis not present

## 2015-04-15 DIAGNOSIS — I10 Essential (primary) hypertension: Secondary | ICD-10-CM | POA: Diagnosis not present

## 2015-04-15 DIAGNOSIS — J45909 Unspecified asthma, uncomplicated: Secondary | ICD-10-CM | POA: Diagnosis not present

## 2015-04-15 DIAGNOSIS — I8310 Varicose veins of unspecified lower extremity with inflammation: Secondary | ICD-10-CM | POA: Diagnosis not present

## 2015-05-15 DIAGNOSIS — F331 Major depressive disorder, recurrent, moderate: Secondary | ICD-10-CM | POA: Diagnosis not present

## 2015-06-03 DIAGNOSIS — E669 Obesity, unspecified: Secondary | ICD-10-CM | POA: Diagnosis present

## 2015-06-03 DIAGNOSIS — J189 Pneumonia, unspecified organism: Secondary | ICD-10-CM | POA: Diagnosis not present

## 2015-06-03 DIAGNOSIS — R001 Bradycardia, unspecified: Secondary | ICD-10-CM | POA: Diagnosis present

## 2015-06-03 DIAGNOSIS — E7800 Pure hypercholesterolemia, unspecified: Secondary | ICD-10-CM | POA: Diagnosis present

## 2015-06-03 DIAGNOSIS — J4541 Moderate persistent asthma with (acute) exacerbation: Secondary | ICD-10-CM | POA: Diagnosis not present

## 2015-06-03 DIAGNOSIS — R0602 Shortness of breath: Secondary | ICD-10-CM | POA: Diagnosis not present

## 2015-06-03 DIAGNOSIS — Z7952 Long term (current) use of systemic steroids: Secondary | ICD-10-CM | POA: Diagnosis not present

## 2015-06-03 DIAGNOSIS — Z79899 Other long term (current) drug therapy: Secondary | ICD-10-CM | POA: Diagnosis not present

## 2015-06-03 DIAGNOSIS — J9811 Atelectasis: Secondary | ICD-10-CM | POA: Diagnosis not present

## 2015-06-03 DIAGNOSIS — I10 Essential (primary) hypertension: Secondary | ICD-10-CM | POA: Diagnosis not present

## 2015-06-03 DIAGNOSIS — E1149 Type 2 diabetes mellitus with other diabetic neurological complication: Secondary | ICD-10-CM | POA: Diagnosis not present

## 2015-06-03 DIAGNOSIS — J180 Bronchopneumonia, unspecified organism: Secondary | ICD-10-CM | POA: Diagnosis not present

## 2015-06-03 DIAGNOSIS — J449 Chronic obstructive pulmonary disease, unspecified: Secondary | ICD-10-CM | POA: Diagnosis not present

## 2015-06-03 DIAGNOSIS — E1165 Type 2 diabetes mellitus with hyperglycemia: Secondary | ICD-10-CM | POA: Diagnosis not present

## 2015-06-03 DIAGNOSIS — J9601 Acute respiratory failure with hypoxia: Secondary | ICD-10-CM | POA: Diagnosis not present

## 2015-06-03 DIAGNOSIS — G4733 Obstructive sleep apnea (adult) (pediatric): Secondary | ICD-10-CM | POA: Diagnosis not present

## 2015-06-03 DIAGNOSIS — K219 Gastro-esophageal reflux disease without esophagitis: Secondary | ICD-10-CM | POA: Diagnosis not present

## 2015-06-03 DIAGNOSIS — R0902 Hypoxemia: Secondary | ICD-10-CM | POA: Diagnosis not present

## 2015-06-03 DIAGNOSIS — E119 Type 2 diabetes mellitus without complications: Secondary | ICD-10-CM | POA: Diagnosis not present

## 2015-09-11 DIAGNOSIS — E785 Hyperlipidemia, unspecified: Secondary | ICD-10-CM | POA: Diagnosis not present

## 2015-09-11 DIAGNOSIS — R32 Unspecified urinary incontinence: Secondary | ICD-10-CM | POA: Diagnosis not present

## 2015-09-11 DIAGNOSIS — E1142 Type 2 diabetes mellitus with diabetic polyneuropathy: Secondary | ICD-10-CM | POA: Diagnosis not present

## 2015-09-11 DIAGNOSIS — I10 Essential (primary) hypertension: Secondary | ICD-10-CM | POA: Diagnosis not present

## 2015-09-11 DIAGNOSIS — Z6841 Body Mass Index (BMI) 40.0 and over, adult: Secondary | ICD-10-CM | POA: Diagnosis not present

## 2015-10-08 DIAGNOSIS — R079 Chest pain, unspecified: Secondary | ICD-10-CM | POA: Diagnosis not present

## 2015-10-09 DIAGNOSIS — J209 Acute bronchitis, unspecified: Secondary | ICD-10-CM | POA: Diagnosis not present

## 2015-10-09 DIAGNOSIS — J4541 Moderate persistent asthma with (acute) exacerbation: Secondary | ICD-10-CM | POA: Diagnosis not present

## 2015-10-09 DIAGNOSIS — A419 Sepsis, unspecified organism: Secondary | ICD-10-CM | POA: Diagnosis not present

## 2015-10-09 DIAGNOSIS — Z79899 Other long term (current) drug therapy: Secondary | ICD-10-CM | POA: Diagnosis not present

## 2015-10-09 DIAGNOSIS — R0600 Dyspnea, unspecified: Secondary | ICD-10-CM | POA: Diagnosis not present

## 2015-10-09 DIAGNOSIS — R0789 Other chest pain: Secondary | ICD-10-CM | POA: Diagnosis not present

## 2015-10-09 DIAGNOSIS — R0602 Shortness of breath: Secondary | ICD-10-CM | POA: Diagnosis not present

## 2015-12-26 DIAGNOSIS — Z23 Encounter for immunization: Secondary | ICD-10-CM | POA: Diagnosis not present

## 2015-12-26 DIAGNOSIS — S81812A Laceration without foreign body, left lower leg, initial encounter: Secondary | ICD-10-CM | POA: Diagnosis not present

## 2016-01-07 DIAGNOSIS — L03116 Cellulitis of left lower limb: Secondary | ICD-10-CM | POA: Diagnosis not present

## 2016-01-09 DIAGNOSIS — Z6841 Body Mass Index (BMI) 40.0 and over, adult: Secondary | ICD-10-CM | POA: Diagnosis not present

## 2016-01-09 DIAGNOSIS — L0390 Cellulitis, unspecified: Secondary | ICD-10-CM | POA: Diagnosis not present

## 2016-01-09 DIAGNOSIS — L03116 Cellulitis of left lower limb: Secondary | ICD-10-CM | POA: Diagnosis not present

## 2016-03-16 DIAGNOSIS — G43119 Migraine with aura, intractable, without status migrainosus: Secondary | ICD-10-CM | POA: Diagnosis not present

## 2016-03-19 DIAGNOSIS — Z7984 Long term (current) use of oral hypoglycemic drugs: Secondary | ICD-10-CM | POA: Diagnosis not present

## 2016-03-19 DIAGNOSIS — H43393 Other vitreous opacities, bilateral: Secondary | ICD-10-CM | POA: Diagnosis not present

## 2016-03-19 DIAGNOSIS — H5211 Myopia, right eye: Secondary | ICD-10-CM | POA: Diagnosis not present

## 2016-03-19 DIAGNOSIS — H524 Presbyopia: Secondary | ICD-10-CM | POA: Diagnosis not present

## 2016-03-19 DIAGNOSIS — H43813 Vitreous degeneration, bilateral: Secondary | ICD-10-CM | POA: Diagnosis not present

## 2016-03-19 DIAGNOSIS — H52222 Regular astigmatism, left eye: Secondary | ICD-10-CM | POA: Diagnosis not present

## 2016-03-19 DIAGNOSIS — I10 Essential (primary) hypertension: Secondary | ICD-10-CM | POA: Diagnosis not present

## 2016-03-19 DIAGNOSIS — H25813 Combined forms of age-related cataract, bilateral: Secondary | ICD-10-CM | POA: Diagnosis not present

## 2016-03-19 DIAGNOSIS — E119 Type 2 diabetes mellitus without complications: Secondary | ICD-10-CM | POA: Diagnosis not present

## 2016-04-21 DIAGNOSIS — J180 Bronchopneumonia, unspecified organism: Secondary | ICD-10-CM | POA: Diagnosis not present

## 2016-04-21 DIAGNOSIS — R918 Other nonspecific abnormal finding of lung field: Secondary | ICD-10-CM | POA: Diagnosis not present

## 2016-04-21 DIAGNOSIS — R072 Precordial pain: Secondary | ICD-10-CM | POA: Diagnosis not present

## 2016-04-21 DIAGNOSIS — J189 Pneumonia, unspecified organism: Secondary | ICD-10-CM | POA: Diagnosis not present

## 2016-04-27 DIAGNOSIS — R05 Cough: Secondary | ICD-10-CM | POA: Diagnosis not present

## 2016-04-27 DIAGNOSIS — K5780 Diverticulitis of intestine, part unspecified, with perforation and abscess without bleeding: Secondary | ICD-10-CM | POA: Diagnosis not present

## 2016-04-27 DIAGNOSIS — R509 Fever, unspecified: Secondary | ICD-10-CM | POA: Diagnosis not present

## 2016-04-27 DIAGNOSIS — N289 Disorder of kidney and ureter, unspecified: Secondary | ICD-10-CM | POA: Diagnosis not present

## 2016-04-27 DIAGNOSIS — R0602 Shortness of breath: Secondary | ICD-10-CM | POA: Diagnosis not present

## 2016-04-27 DIAGNOSIS — K5730 Diverticulosis of large intestine without perforation or abscess without bleeding: Secondary | ICD-10-CM | POA: Diagnosis not present

## 2016-04-28 DIAGNOSIS — Z79899 Other long term (current) drug therapy: Secondary | ICD-10-CM | POA: Diagnosis not present

## 2016-04-28 DIAGNOSIS — Z23 Encounter for immunization: Secondary | ICD-10-CM | POA: Diagnosis not present

## 2016-04-28 DIAGNOSIS — Z888 Allergy status to other drugs, medicaments and biological substances status: Secondary | ICD-10-CM | POA: Diagnosis not present

## 2016-04-28 DIAGNOSIS — R0602 Shortness of breath: Secondary | ICD-10-CM | POA: Diagnosis not present

## 2016-04-28 DIAGNOSIS — N289 Disorder of kidney and ureter, unspecified: Secondary | ICD-10-CM | POA: Diagnosis not present

## 2016-04-28 DIAGNOSIS — R5383 Other fatigue: Secondary | ICD-10-CM | POA: Diagnosis present

## 2016-04-28 DIAGNOSIS — R509 Fever, unspecified: Secondary | ICD-10-CM | POA: Diagnosis not present

## 2016-04-28 DIAGNOSIS — E876 Hypokalemia: Secondary | ICD-10-CM | POA: Diagnosis present

## 2016-04-28 DIAGNOSIS — Z91040 Latex allergy status: Secondary | ICD-10-CM | POA: Diagnosis not present

## 2016-04-28 DIAGNOSIS — K5730 Diverticulosis of large intestine without perforation or abscess without bleeding: Secondary | ICD-10-CM | POA: Diagnosis not present

## 2016-04-28 DIAGNOSIS — K5780 Diverticulitis of intestine, part unspecified, with perforation and abscess without bleeding: Secondary | ICD-10-CM | POA: Diagnosis not present

## 2016-04-28 DIAGNOSIS — Z87891 Personal history of nicotine dependence: Secondary | ICD-10-CM | POA: Diagnosis not present

## 2016-04-28 DIAGNOSIS — G4733 Obstructive sleep apnea (adult) (pediatric): Secondary | ICD-10-CM | POA: Diagnosis present

## 2016-04-28 DIAGNOSIS — Z880 Allergy status to penicillin: Secondary | ICD-10-CM | POA: Diagnosis not present

## 2016-04-28 DIAGNOSIS — Z6841 Body Mass Index (BMI) 40.0 and over, adult: Secondary | ICD-10-CM | POA: Diagnosis not present

## 2016-04-28 DIAGNOSIS — K5720 Diverticulitis of large intestine with perforation and abscess without bleeding: Secondary | ICD-10-CM | POA: Diagnosis not present

## 2016-04-28 DIAGNOSIS — J449 Chronic obstructive pulmonary disease, unspecified: Secondary | ICD-10-CM | POA: Diagnosis present

## 2016-04-28 DIAGNOSIS — I959 Hypotension, unspecified: Secondary | ICD-10-CM | POA: Diagnosis present

## 2016-04-28 DIAGNOSIS — R05 Cough: Secondary | ICD-10-CM | POA: Diagnosis not present

## 2016-04-28 DIAGNOSIS — R531 Weakness: Secondary | ICD-10-CM | POA: Diagnosis not present

## 2016-04-28 DIAGNOSIS — K219 Gastro-esophageal reflux disease without esophagitis: Secondary | ICD-10-CM | POA: Diagnosis present

## 2016-04-28 DIAGNOSIS — Z452 Encounter for adjustment and management of vascular access device: Secondary | ICD-10-CM | POA: Diagnosis not present

## 2016-05-28 DIAGNOSIS — R0902 Hypoxemia: Secondary | ICD-10-CM | POA: Diagnosis not present

## 2016-05-28 DIAGNOSIS — J4541 Moderate persistent asthma with (acute) exacerbation: Secondary | ICD-10-CM | POA: Diagnosis not present

## 2016-05-28 DIAGNOSIS — Z9989 Dependence on other enabling machines and devices: Secondary | ICD-10-CM | POA: Diagnosis not present

## 2016-05-28 DIAGNOSIS — Z79899 Other long term (current) drug therapy: Secondary | ICD-10-CM | POA: Diagnosis not present

## 2016-05-28 DIAGNOSIS — I509 Heart failure, unspecified: Secondary | ICD-10-CM | POA: Diagnosis not present

## 2016-05-28 DIAGNOSIS — J9600 Acute respiratory failure, unspecified whether with hypoxia or hypercapnia: Secondary | ICD-10-CM | POA: Diagnosis not present

## 2016-05-28 DIAGNOSIS — R918 Other nonspecific abnormal finding of lung field: Secondary | ICD-10-CM | POA: Diagnosis not present

## 2016-05-28 DIAGNOSIS — Z888 Allergy status to other drugs, medicaments and biological substances status: Secondary | ICD-10-CM | POA: Diagnosis not present

## 2016-05-28 DIAGNOSIS — Z880 Allergy status to penicillin: Secondary | ICD-10-CM | POA: Diagnosis not present

## 2016-05-28 DIAGNOSIS — J181 Lobar pneumonia, unspecified organism: Secondary | ICD-10-CM | POA: Diagnosis not present

## 2016-05-28 DIAGNOSIS — Z91040 Latex allergy status: Secondary | ICD-10-CM | POA: Diagnosis not present

## 2016-05-28 DIAGNOSIS — J9601 Acute respiratory failure with hypoxia: Secondary | ICD-10-CM | POA: Diagnosis present

## 2016-05-28 DIAGNOSIS — J9690 Respiratory failure, unspecified, unspecified whether with hypoxia or hypercapnia: Secondary | ICD-10-CM | POA: Diagnosis not present

## 2016-05-28 DIAGNOSIS — Z9981 Dependence on supplemental oxygen: Secondary | ICD-10-CM | POA: Diagnosis not present

## 2016-05-28 DIAGNOSIS — E1149 Type 2 diabetes mellitus with other diabetic neurological complication: Secondary | ICD-10-CM | POA: Diagnosis not present

## 2016-05-28 DIAGNOSIS — J180 Bronchopneumonia, unspecified organism: Secondary | ICD-10-CM | POA: Diagnosis not present

## 2016-05-28 DIAGNOSIS — J189 Pneumonia, unspecified organism: Secondary | ICD-10-CM | POA: Diagnosis not present

## 2016-05-28 DIAGNOSIS — R05 Cough: Secondary | ICD-10-CM | POA: Diagnosis not present

## 2016-05-28 DIAGNOSIS — M1990 Unspecified osteoarthritis, unspecified site: Secondary | ICD-10-CM | POA: Diagnosis present

## 2016-05-28 DIAGNOSIS — F418 Other specified anxiety disorders: Secondary | ICD-10-CM | POA: Diagnosis present

## 2016-05-28 DIAGNOSIS — Z8701 Personal history of pneumonia (recurrent): Secondary | ICD-10-CM | POA: Diagnosis not present

## 2016-05-28 DIAGNOSIS — G4733 Obstructive sleep apnea (adult) (pediatric): Secondary | ICD-10-CM | POA: Diagnosis not present

## 2016-05-28 DIAGNOSIS — F319 Bipolar disorder, unspecified: Secondary | ICD-10-CM | POA: Diagnosis present

## 2016-05-28 DIAGNOSIS — I10 Essential (primary) hypertension: Secondary | ICD-10-CM | POA: Diagnosis not present

## 2016-05-28 DIAGNOSIS — K219 Gastro-esophageal reflux disease without esophagitis: Secondary | ICD-10-CM | POA: Diagnosis not present

## 2016-05-28 DIAGNOSIS — J90 Pleural effusion, not elsewhere classified: Secondary | ICD-10-CM | POA: Diagnosis not present

## 2016-05-28 DIAGNOSIS — Z87891 Personal history of nicotine dependence: Secondary | ICD-10-CM | POA: Diagnosis not present

## 2016-05-28 DIAGNOSIS — E7800 Pure hypercholesterolemia, unspecified: Secondary | ICD-10-CM | POA: Diagnosis present

## 2016-05-28 DIAGNOSIS — E119 Type 2 diabetes mellitus without complications: Secondary | ICD-10-CM | POA: Diagnosis not present

## 2016-06-02 DIAGNOSIS — I10 Essential (primary) hypertension: Secondary | ICD-10-CM

## 2016-06-02 DIAGNOSIS — R0902 Hypoxemia: Secondary | ICD-10-CM | POA: Diagnosis not present

## 2016-06-02 DIAGNOSIS — Z9989 Dependence on other enabling machines and devices: Secondary | ICD-10-CM | POA: Diagnosis not present

## 2016-06-02 DIAGNOSIS — G4733 Obstructive sleep apnea (adult) (pediatric): Secondary | ICD-10-CM | POA: Diagnosis not present

## 2016-06-02 DIAGNOSIS — J9601 Acute respiratory failure with hypoxia: Secondary | ICD-10-CM | POA: Diagnosis not present

## 2016-06-02 DIAGNOSIS — J180 Bronchopneumonia, unspecified organism: Secondary | ICD-10-CM

## 2016-06-02 DIAGNOSIS — E119 Type 2 diabetes mellitus without complications: Secondary | ICD-10-CM

## 2016-06-02 DIAGNOSIS — Z9981 Dependence on supplemental oxygen: Secondary | ICD-10-CM | POA: Diagnosis not present

## 2016-06-02 DIAGNOSIS — J9600 Acute respiratory failure, unspecified whether with hypoxia or hypercapnia: Secondary | ICD-10-CM

## 2016-06-02 DIAGNOSIS — K219 Gastro-esophageal reflux disease without esophagitis: Secondary | ICD-10-CM

## 2016-06-02 DIAGNOSIS — J4541 Moderate persistent asthma with (acute) exacerbation: Secondary | ICD-10-CM | POA: Diagnosis not present

## 2016-06-02 DIAGNOSIS — J189 Pneumonia, unspecified organism: Secondary | ICD-10-CM | POA: Diagnosis not present

## 2016-06-07 DIAGNOSIS — Z6841 Body Mass Index (BMI) 40.0 and over, adult: Secondary | ICD-10-CM | POA: Diagnosis not present

## 2016-06-07 DIAGNOSIS — F419 Anxiety disorder, unspecified: Secondary | ICD-10-CM | POA: Diagnosis not present

## 2016-06-07 DIAGNOSIS — R110 Nausea: Secondary | ICD-10-CM | POA: Diagnosis not present

## 2016-06-07 DIAGNOSIS — J9690 Respiratory failure, unspecified, unspecified whether with hypoxia or hypercapnia: Secondary | ICD-10-CM | POA: Diagnosis not present

## 2016-06-07 DIAGNOSIS — D649 Anemia, unspecified: Secondary | ICD-10-CM | POA: Diagnosis present

## 2016-06-07 DIAGNOSIS — J440 Chronic obstructive pulmonary disease with acute lower respiratory infection: Secondary | ICD-10-CM | POA: Diagnosis present

## 2016-06-07 DIAGNOSIS — J9622 Acute and chronic respiratory failure with hypercapnia: Secondary | ICD-10-CM | POA: Diagnosis present

## 2016-06-07 DIAGNOSIS — G8929 Other chronic pain: Secondary | ICD-10-CM | POA: Diagnosis present

## 2016-06-07 DIAGNOSIS — J9621 Acute and chronic respiratory failure with hypoxia: Secondary | ICD-10-CM | POA: Diagnosis present

## 2016-06-07 DIAGNOSIS — E669 Obesity, unspecified: Secondary | ICD-10-CM | POA: Diagnosis not present

## 2016-06-07 DIAGNOSIS — Z9989 Dependence on other enabling machines and devices: Secondary | ICD-10-CM | POA: Diagnosis not present

## 2016-06-07 DIAGNOSIS — S86191A Other injury of other muscle(s) and tendon(s) of posterior muscle group at lower leg level, right leg, initial encounter: Secondary | ICD-10-CM | POA: Diagnosis not present

## 2016-06-07 DIAGNOSIS — M791 Myalgia: Secondary | ICD-10-CM | POA: Diagnosis not present

## 2016-06-07 DIAGNOSIS — J4541 Moderate persistent asthma with (acute) exacerbation: Secondary | ICD-10-CM | POA: Diagnosis not present

## 2016-06-07 DIAGNOSIS — Z7401 Bed confinement status: Secondary | ICD-10-CM | POA: Diagnosis not present

## 2016-06-07 DIAGNOSIS — Z794 Long term (current) use of insulin: Secondary | ICD-10-CM | POA: Diagnosis not present

## 2016-06-07 DIAGNOSIS — Z9981 Dependence on supplemental oxygen: Secondary | ICD-10-CM | POA: Diagnosis not present

## 2016-06-07 DIAGNOSIS — M1990 Unspecified osteoarthritis, unspecified site: Secondary | ICD-10-CM | POA: Diagnosis present

## 2016-06-07 DIAGNOSIS — E1140 Type 2 diabetes mellitus with diabetic neuropathy, unspecified: Secondary | ICD-10-CM | POA: Diagnosis present

## 2016-06-07 DIAGNOSIS — Z7902 Long term (current) use of antithrombotics/antiplatelets: Secondary | ICD-10-CM | POA: Diagnosis not present

## 2016-06-07 DIAGNOSIS — J441 Chronic obstructive pulmonary disease with (acute) exacerbation: Secondary | ICD-10-CM | POA: Diagnosis not present

## 2016-06-07 DIAGNOSIS — J9601 Acute respiratory failure with hypoxia: Secondary | ICD-10-CM | POA: Diagnosis not present

## 2016-06-07 DIAGNOSIS — I10 Essential (primary) hypertension: Secondary | ICD-10-CM | POA: Diagnosis not present

## 2016-06-07 DIAGNOSIS — F331 Major depressive disorder, recurrent, moderate: Secondary | ICD-10-CM | POA: Diagnosis present

## 2016-06-07 DIAGNOSIS — J9620 Acute and chronic respiratory failure, unspecified whether with hypoxia or hypercapnia: Secondary | ICD-10-CM | POA: Diagnosis not present

## 2016-06-07 DIAGNOSIS — F319 Bipolar disorder, unspecified: Secondary | ICD-10-CM | POA: Diagnosis not present

## 2016-06-07 DIAGNOSIS — R262 Difficulty in walking, not elsewhere classified: Secondary | ICD-10-CM | POA: Diagnosis not present

## 2016-06-07 DIAGNOSIS — M6281 Muscle weakness (generalized): Secondary | ICD-10-CM | POA: Diagnosis not present

## 2016-06-07 DIAGNOSIS — M549 Dorsalgia, unspecified: Secondary | ICD-10-CM | POA: Diagnosis present

## 2016-06-07 DIAGNOSIS — J449 Chronic obstructive pulmonary disease, unspecified: Secondary | ICD-10-CM | POA: Diagnosis not present

## 2016-06-07 DIAGNOSIS — E785 Hyperlipidemia, unspecified: Secondary | ICD-10-CM | POA: Diagnosis present

## 2016-06-07 DIAGNOSIS — J180 Bronchopneumonia, unspecified organism: Secondary | ICD-10-CM | POA: Diagnosis not present

## 2016-06-07 DIAGNOSIS — E119 Type 2 diabetes mellitus without complications: Secondary | ICD-10-CM | POA: Diagnosis not present

## 2016-06-07 DIAGNOSIS — J9600 Acute respiratory failure, unspecified whether with hypoxia or hypercapnia: Secondary | ICD-10-CM | POA: Diagnosis not present

## 2016-06-07 DIAGNOSIS — R5381 Other malaise: Secondary | ICD-10-CM | POA: Diagnosis not present

## 2016-06-07 DIAGNOSIS — R531 Weakness: Secondary | ICD-10-CM | POA: Diagnosis not present

## 2016-06-07 DIAGNOSIS — R0902 Hypoxemia: Secondary | ICD-10-CM | POA: Diagnosis not present

## 2016-06-07 DIAGNOSIS — J189 Pneumonia, unspecified organism: Secondary | ICD-10-CM | POA: Diagnosis not present

## 2016-06-07 DIAGNOSIS — Z7952 Long term (current) use of systemic steroids: Secondary | ICD-10-CM | POA: Diagnosis not present

## 2016-06-07 DIAGNOSIS — G4733 Obstructive sleep apnea (adult) (pediatric): Secondary | ICD-10-CM | POA: Diagnosis present

## 2016-06-07 DIAGNOSIS — K219 Gastro-esophageal reflux disease without esophagitis: Secondary | ICD-10-CM | POA: Diagnosis not present

## 2016-06-19 DIAGNOSIS — Z8701 Personal history of pneumonia (recurrent): Secondary | ICD-10-CM | POA: Diagnosis not present

## 2016-06-19 DIAGNOSIS — J441 Chronic obstructive pulmonary disease with (acute) exacerbation: Secondary | ICD-10-CM | POA: Diagnosis not present

## 2016-06-19 DIAGNOSIS — K219 Gastro-esophageal reflux disease without esophagitis: Secondary | ICD-10-CM | POA: Diagnosis not present

## 2016-06-19 DIAGNOSIS — E119 Type 2 diabetes mellitus without complications: Secondary | ICD-10-CM | POA: Diagnosis not present

## 2016-06-19 DIAGNOSIS — F319 Bipolar disorder, unspecified: Secondary | ICD-10-CM | POA: Diagnosis not present

## 2016-06-19 DIAGNOSIS — J45909 Unspecified asthma, uncomplicated: Secondary | ICD-10-CM | POA: Diagnosis not present

## 2016-06-29 IMAGING — CR DG CHEST 2V
1 series · 2 of 2 positions shown · non-contrast
Comparison: None.

CLINICAL DATA: Cough and congestion

EXAM:
CHEST  2 VIEW

[Series 1: dg chest 2 view · 0.14mm/px · 2 of 2 slices shown]
[im 1/2]
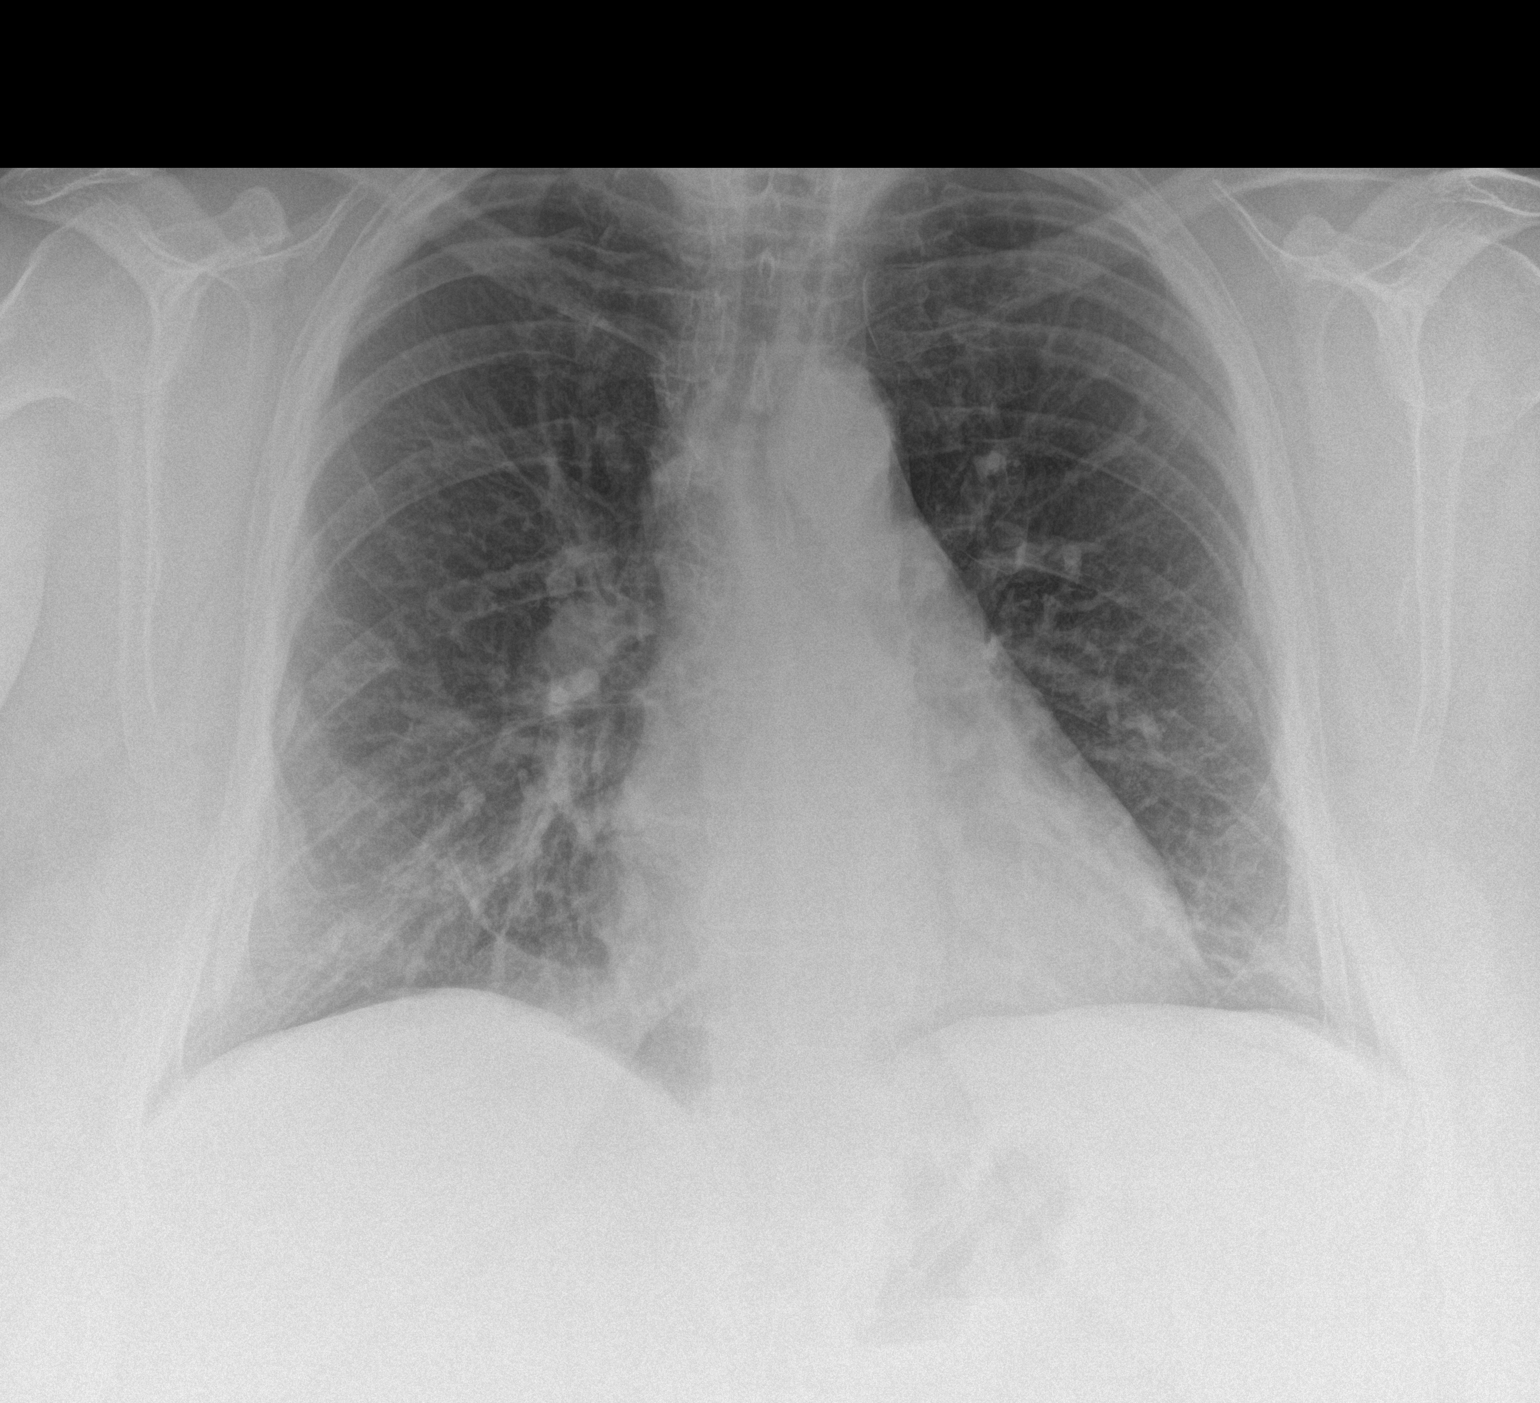
[im 2/2]
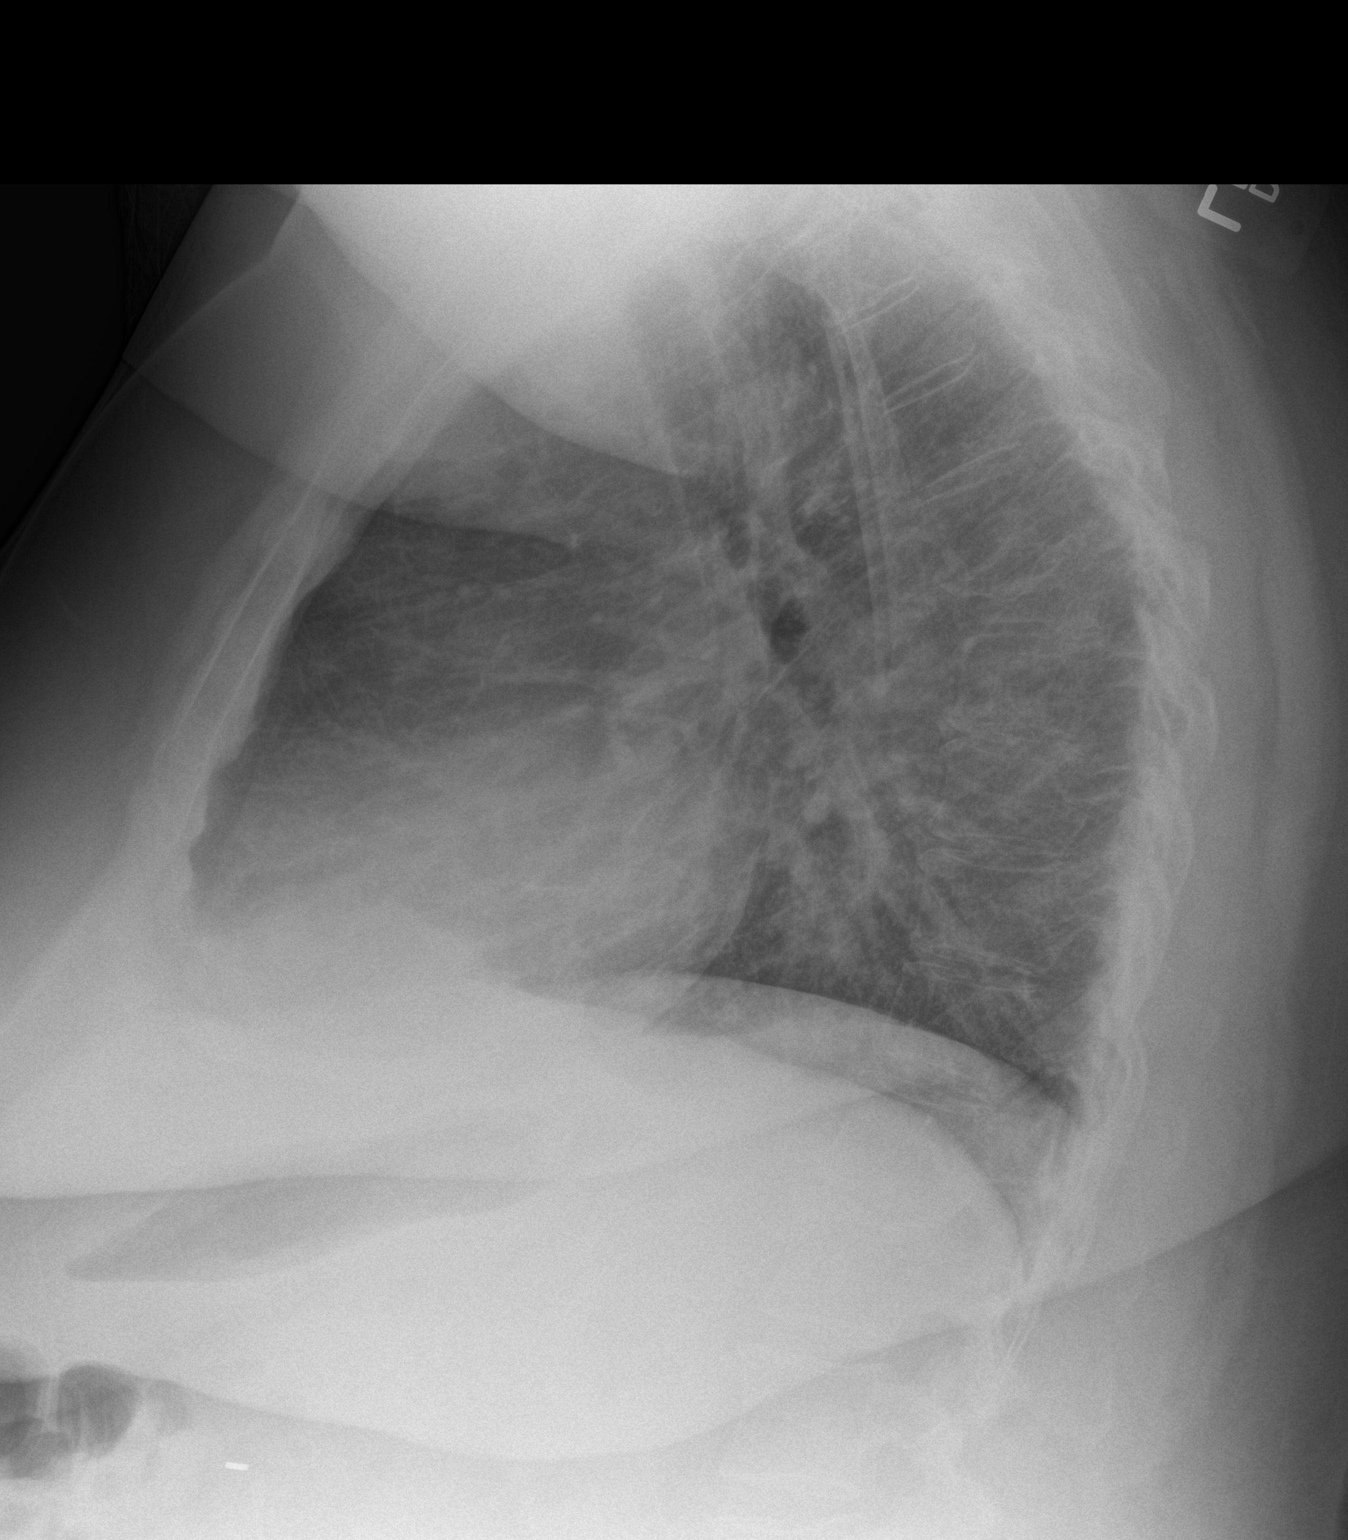

[2 of 2 positions shown; findings below may reference images not displayed]

FINDINGS: There is no edema or consolidation. The heart size and pulmonary
vascularity are within normal limits. No adenopathy. No focal bone
lesions are appreciable.
IMPRESSION: No edema or consolidation.

## 2016-07-30 DIAGNOSIS — R5383 Other fatigue: Secondary | ICD-10-CM | POA: Diagnosis not present

## 2016-07-30 DIAGNOSIS — G4733 Obstructive sleep apnea (adult) (pediatric): Secondary | ICD-10-CM | POA: Diagnosis not present

## 2016-07-30 DIAGNOSIS — R0600 Dyspnea, unspecified: Secondary | ICD-10-CM | POA: Diagnosis not present

## 2016-07-30 DIAGNOSIS — J4540 Moderate persistent asthma, uncomplicated: Secondary | ICD-10-CM | POA: Diagnosis not present

## 2016-07-30 DIAGNOSIS — J301 Allergic rhinitis due to pollen: Secondary | ICD-10-CM | POA: Diagnosis not present

## 2016-09-22 DIAGNOSIS — F331 Major depressive disorder, recurrent, moderate: Secondary | ICD-10-CM | POA: Diagnosis not present

## 2016-09-30 DIAGNOSIS — J440 Chronic obstructive pulmonary disease with acute lower respiratory infection: Secondary | ICD-10-CM | POA: Diagnosis not present

## 2016-09-30 DIAGNOSIS — J181 Lobar pneumonia, unspecified organism: Secondary | ICD-10-CM | POA: Diagnosis not present

## 2016-09-30 DIAGNOSIS — Z87440 Personal history of urinary (tract) infections: Secondary | ICD-10-CM | POA: Diagnosis not present

## 2016-09-30 DIAGNOSIS — Z880 Allergy status to penicillin: Secondary | ICD-10-CM | POA: Diagnosis not present

## 2016-09-30 DIAGNOSIS — E119 Type 2 diabetes mellitus without complications: Secondary | ICD-10-CM | POA: Diagnosis not present

## 2016-09-30 DIAGNOSIS — R0602 Shortness of breath: Secondary | ICD-10-CM | POA: Diagnosis not present

## 2016-09-30 DIAGNOSIS — R079 Chest pain, unspecified: Secondary | ICD-10-CM | POA: Diagnosis not present

## 2016-09-30 DIAGNOSIS — N3001 Acute cystitis with hematuria: Secondary | ICD-10-CM | POA: Diagnosis not present

## 2016-09-30 DIAGNOSIS — E1149 Type 2 diabetes mellitus with other diabetic neurological complication: Secondary | ICD-10-CM | POA: Diagnosis present

## 2016-09-30 DIAGNOSIS — K219 Gastro-esophageal reflux disease without esophagitis: Secondary | ICD-10-CM | POA: Diagnosis not present

## 2016-09-30 DIAGNOSIS — F329 Major depressive disorder, single episode, unspecified: Secondary | ICD-10-CM | POA: Diagnosis not present

## 2016-09-30 DIAGNOSIS — E7800 Pure hypercholesterolemia, unspecified: Secondary | ICD-10-CM | POA: Diagnosis present

## 2016-09-30 DIAGNOSIS — I10 Essential (primary) hypertension: Secondary | ICD-10-CM | POA: Diagnosis not present

## 2016-09-30 DIAGNOSIS — Z8701 Personal history of pneumonia (recurrent): Secondary | ICD-10-CM | POA: Diagnosis not present

## 2016-09-30 DIAGNOSIS — Z888 Allergy status to other drugs, medicaments and biological substances status: Secondary | ICD-10-CM | POA: Diagnosis not present

## 2016-09-30 DIAGNOSIS — M1990 Unspecified osteoarthritis, unspecified site: Secondary | ICD-10-CM | POA: Diagnosis present

## 2016-09-30 DIAGNOSIS — J449 Chronic obstructive pulmonary disease, unspecified: Secondary | ICD-10-CM | POA: Diagnosis not present

## 2016-09-30 DIAGNOSIS — E876 Hypokalemia: Secondary | ICD-10-CM | POA: Diagnosis not present

## 2016-09-30 DIAGNOSIS — J9611 Chronic respiratory failure with hypoxia: Secondary | ICD-10-CM | POA: Diagnosis not present

## 2016-09-30 DIAGNOSIS — R0603 Acute respiratory distress: Secondary | ICD-10-CM | POA: Diagnosis not present

## 2016-09-30 DIAGNOSIS — J189 Pneumonia, unspecified organism: Secondary | ICD-10-CM | POA: Diagnosis not present

## 2016-09-30 DIAGNOSIS — N390 Urinary tract infection, site not specified: Secondary | ICD-10-CM | POA: Diagnosis not present

## 2016-09-30 DIAGNOSIS — E860 Dehydration: Secondary | ICD-10-CM | POA: Diagnosis present

## 2016-09-30 DIAGNOSIS — G4733 Obstructive sleep apnea (adult) (pediatric): Secondary | ICD-10-CM | POA: Diagnosis not present

## 2016-09-30 DIAGNOSIS — Z9981 Dependence on supplemental oxygen: Secondary | ICD-10-CM | POA: Diagnosis not present

## 2016-09-30 DIAGNOSIS — Z6841 Body Mass Index (BMI) 40.0 and over, adult: Secondary | ICD-10-CM | POA: Diagnosis not present

## 2016-09-30 DIAGNOSIS — B9620 Unspecified Escherichia coli [E. coli] as the cause of diseases classified elsewhere: Secondary | ICD-10-CM | POA: Diagnosis present

## 2016-09-30 DIAGNOSIS — Z79899 Other long term (current) drug therapy: Secondary | ICD-10-CM | POA: Diagnosis not present

## 2016-11-08 DIAGNOSIS — F331 Major depressive disorder, recurrent, moderate: Secondary | ICD-10-CM | POA: Diagnosis not present

## 2016-12-07 DIAGNOSIS — J449 Chronic obstructive pulmonary disease, unspecified: Secondary | ICD-10-CM | POA: Diagnosis not present

## 2016-12-07 DIAGNOSIS — J189 Pneumonia, unspecified organism: Secondary | ICD-10-CM | POA: Diagnosis not present

## 2016-12-07 DIAGNOSIS — R05 Cough: Secondary | ICD-10-CM | POA: Diagnosis not present

## 2016-12-07 DIAGNOSIS — R0600 Dyspnea, unspecified: Secondary | ICD-10-CM | POA: Diagnosis not present

## 2016-12-07 DIAGNOSIS — Z9981 Dependence on supplemental oxygen: Secondary | ICD-10-CM | POA: Diagnosis not present

## 2016-12-08 DIAGNOSIS — R05 Cough: Secondary | ICD-10-CM | POA: Diagnosis not present

## 2017-03-08 DIAGNOSIS — R0602 Shortness of breath: Secondary | ICD-10-CM | POA: Diagnosis not present

## 2017-03-08 DIAGNOSIS — J449 Chronic obstructive pulmonary disease, unspecified: Secondary | ICD-10-CM | POA: Diagnosis not present

## 2017-03-08 DIAGNOSIS — Z9119 Patient's noncompliance with other medical treatment and regimen: Secondary | ICD-10-CM | POA: Diagnosis not present

## 2017-03-08 DIAGNOSIS — J209 Acute bronchitis, unspecified: Secondary | ICD-10-CM | POA: Diagnosis not present

## 2017-03-08 DIAGNOSIS — I160 Hypertensive urgency: Secondary | ICD-10-CM | POA: Diagnosis not present

## 2017-03-08 DIAGNOSIS — Z9114 Patient's other noncompliance with medication regimen: Secondary | ICD-10-CM | POA: Diagnosis not present

## 2017-03-08 DIAGNOSIS — R0789 Other chest pain: Secondary | ICD-10-CM | POA: Diagnosis not present

## 2017-03-08 DIAGNOSIS — E119 Type 2 diabetes mellitus without complications: Secondary | ICD-10-CM | POA: Diagnosis not present

## 2017-05-17 DIAGNOSIS — R079 Chest pain, unspecified: Secondary | ICD-10-CM | POA: Diagnosis not present

## 2017-05-17 DIAGNOSIS — R0789 Other chest pain: Secondary | ICD-10-CM | POA: Diagnosis not present

## 2017-05-17 DIAGNOSIS — J441 Chronic obstructive pulmonary disease with (acute) exacerbation: Secondary | ICD-10-CM | POA: Diagnosis not present

## 2017-05-17 DIAGNOSIS — Z79899 Other long term (current) drug therapy: Secondary | ICD-10-CM | POA: Diagnosis not present

## 2017-05-17 DIAGNOSIS — R0602 Shortness of breath: Secondary | ICD-10-CM | POA: Diagnosis not present

## 2017-06-16 DIAGNOSIS — R0603 Acute respiratory distress: Secondary | ICD-10-CM | POA: Diagnosis not present

## 2017-06-16 DIAGNOSIS — Z9981 Dependence on supplemental oxygen: Secondary | ICD-10-CM | POA: Diagnosis not present

## 2017-06-16 DIAGNOSIS — J9811 Atelectasis: Secondary | ICD-10-CM | POA: Diagnosis not present

## 2017-06-16 DIAGNOSIS — M1990 Unspecified osteoarthritis, unspecified site: Secondary | ICD-10-CM | POA: Diagnosis present

## 2017-06-16 DIAGNOSIS — R0602 Shortness of breath: Secondary | ICD-10-CM | POA: Diagnosis not present

## 2017-06-16 DIAGNOSIS — G4733 Obstructive sleep apnea (adult) (pediatric): Secondary | ICD-10-CM | POA: Diagnosis not present

## 2017-06-16 DIAGNOSIS — F329 Major depressive disorder, single episode, unspecified: Secondary | ICD-10-CM | POA: Diagnosis present

## 2017-06-16 DIAGNOSIS — Z6841 Body Mass Index (BMI) 40.0 and over, adult: Secondary | ICD-10-CM | POA: Diagnosis not present

## 2017-06-16 DIAGNOSIS — E662 Morbid (severe) obesity with alveolar hypoventilation: Secondary | ICD-10-CM | POA: Diagnosis not present

## 2017-06-16 DIAGNOSIS — J9690 Respiratory failure, unspecified, unspecified whether with hypoxia or hypercapnia: Secondary | ICD-10-CM | POA: Diagnosis not present

## 2017-06-16 DIAGNOSIS — Z880 Allergy status to penicillin: Secondary | ICD-10-CM | POA: Diagnosis not present

## 2017-06-16 DIAGNOSIS — E119 Type 2 diabetes mellitus without complications: Secondary | ICD-10-CM | POA: Diagnosis not present

## 2017-06-16 DIAGNOSIS — J9600 Acute respiratory failure, unspecified whether with hypoxia or hypercapnia: Secondary | ICD-10-CM | POA: Diagnosis not present

## 2017-06-16 DIAGNOSIS — E7800 Pure hypercholesterolemia, unspecified: Secondary | ICD-10-CM | POA: Diagnosis present

## 2017-06-16 DIAGNOSIS — I110 Hypertensive heart disease with heart failure: Secondary | ICD-10-CM | POA: Diagnosis present

## 2017-06-16 DIAGNOSIS — J9621 Acute and chronic respiratory failure with hypoxia: Secondary | ICD-10-CM | POA: Diagnosis not present

## 2017-06-16 DIAGNOSIS — J9620 Acute and chronic respiratory failure, unspecified whether with hypoxia or hypercapnia: Secondary | ICD-10-CM | POA: Diagnosis not present

## 2017-06-16 DIAGNOSIS — J441 Chronic obstructive pulmonary disease with (acute) exacerbation: Secondary | ICD-10-CM | POA: Diagnosis not present

## 2017-06-16 DIAGNOSIS — Z79899 Other long term (current) drug therapy: Secondary | ICD-10-CM | POA: Diagnosis not present

## 2017-06-16 DIAGNOSIS — F418 Other specified anxiety disorders: Secondary | ICD-10-CM | POA: Diagnosis present

## 2017-06-16 DIAGNOSIS — J4541 Moderate persistent asthma with (acute) exacerbation: Secondary | ICD-10-CM | POA: Diagnosis not present

## 2017-06-16 DIAGNOSIS — J069 Acute upper respiratory infection, unspecified: Secondary | ICD-10-CM | POA: Diagnosis present

## 2017-06-16 DIAGNOSIS — Z888 Allergy status to other drugs, medicaments and biological substances status: Secondary | ICD-10-CM | POA: Diagnosis not present

## 2017-06-16 DIAGNOSIS — I5033 Acute on chronic diastolic (congestive) heart failure: Secondary | ICD-10-CM | POA: Diagnosis not present

## 2017-06-17 DIAGNOSIS — R0602 Shortness of breath: Secondary | ICD-10-CM

## 2017-07-08 DIAGNOSIS — S01311A Laceration without foreign body of right ear, initial encounter: Secondary | ICD-10-CM | POA: Diagnosis not present

## 2017-07-08 DIAGNOSIS — S0181XA Laceration without foreign body of other part of head, initial encounter: Secondary | ICD-10-CM | POA: Diagnosis not present

## 2017-07-08 DIAGNOSIS — Z23 Encounter for immunization: Secondary | ICD-10-CM | POA: Diagnosis not present

## 2017-07-08 DIAGNOSIS — S50811A Abrasion of right forearm, initial encounter: Secondary | ICD-10-CM | POA: Diagnosis not present

## 2017-07-17 DIAGNOSIS — Z9981 Dependence on supplemental oxygen: Secondary | ICD-10-CM | POA: Diagnosis not present

## 2017-07-17 DIAGNOSIS — E119 Type 2 diabetes mellitus without complications: Secondary | ICD-10-CM | POA: Diagnosis not present

## 2017-07-17 DIAGNOSIS — Z9049 Acquired absence of other specified parts of digestive tract: Secondary | ICD-10-CM | POA: Diagnosis not present

## 2017-07-17 DIAGNOSIS — E876 Hypokalemia: Secondary | ICD-10-CM | POA: Diagnosis not present

## 2017-07-17 DIAGNOSIS — J441 Chronic obstructive pulmonary disease with (acute) exacerbation: Secondary | ICD-10-CM | POA: Diagnosis not present

## 2017-07-17 DIAGNOSIS — R079 Chest pain, unspecified: Secondary | ICD-10-CM | POA: Diagnosis not present

## 2017-07-17 DIAGNOSIS — I10 Essential (primary) hypertension: Secondary | ICD-10-CM | POA: Diagnosis not present

## 2017-07-17 DIAGNOSIS — M545 Low back pain: Secondary | ICD-10-CM | POA: Diagnosis not present

## 2017-07-17 DIAGNOSIS — Z87891 Personal history of nicotine dependence: Secondary | ICD-10-CM | POA: Diagnosis not present

## 2017-07-17 DIAGNOSIS — R0602 Shortness of breath: Secondary | ICD-10-CM | POA: Diagnosis not present

## 2017-07-17 DIAGNOSIS — M549 Dorsalgia, unspecified: Secondary | ICD-10-CM | POA: Diagnosis not present

## 2017-07-26 DIAGNOSIS — S3992XA Unspecified injury of lower back, initial encounter: Secondary | ICD-10-CM | POA: Diagnosis not present

## 2017-07-26 DIAGNOSIS — K5730 Diverticulosis of large intestine without perforation or abscess without bleeding: Secondary | ICD-10-CM | POA: Diagnosis not present

## 2017-07-26 DIAGNOSIS — M545 Low back pain: Secondary | ICD-10-CM | POA: Diagnosis not present

## 2017-07-26 DIAGNOSIS — R079 Chest pain, unspecified: Secondary | ICD-10-CM | POA: Diagnosis not present

## 2017-07-26 DIAGNOSIS — S22060A Wedge compression fracture of T7-T8 vertebra, initial encounter for closed fracture: Secondary | ICD-10-CM | POA: Diagnosis not present

## 2017-07-27 DIAGNOSIS — Z79899 Other long term (current) drug therapy: Secondary | ICD-10-CM | POA: Diagnosis not present

## 2017-07-27 DIAGNOSIS — R0602 Shortness of breath: Secondary | ICD-10-CM | POA: Diagnosis not present

## 2017-07-27 DIAGNOSIS — M545 Low back pain: Secondary | ICD-10-CM | POA: Diagnosis not present

## 2017-07-27 DIAGNOSIS — F319 Bipolar disorder, unspecified: Secondary | ICD-10-CM | POA: Diagnosis present

## 2017-07-27 DIAGNOSIS — K5792 Diverticulitis of intestine, part unspecified, without perforation or abscess without bleeding: Secondary | ICD-10-CM | POA: Diagnosis not present

## 2017-07-27 DIAGNOSIS — S22089A Unspecified fracture of T11-T12 vertebra, initial encounter for closed fracture: Secondary | ICD-10-CM | POA: Diagnosis not present

## 2017-07-27 DIAGNOSIS — E1142 Type 2 diabetes mellitus with diabetic polyneuropathy: Secondary | ICD-10-CM | POA: Diagnosis not present

## 2017-07-27 DIAGNOSIS — K5730 Diverticulosis of large intestine without perforation or abscess without bleeding: Secondary | ICD-10-CM | POA: Diagnosis not present

## 2017-07-27 DIAGNOSIS — S22060A Wedge compression fracture of T7-T8 vertebra, initial encounter for closed fracture: Secondary | ICD-10-CM | POA: Diagnosis not present

## 2017-07-27 DIAGNOSIS — R110 Nausea: Secondary | ICD-10-CM | POA: Diagnosis not present

## 2017-07-27 DIAGNOSIS — E785 Hyperlipidemia, unspecified: Secondary | ICD-10-CM | POA: Diagnosis not present

## 2017-07-27 DIAGNOSIS — Z7984 Long term (current) use of oral hypoglycemic drugs: Secondary | ICD-10-CM | POA: Diagnosis not present

## 2017-07-27 DIAGNOSIS — K2970 Gastritis, unspecified, without bleeding: Secondary | ICD-10-CM | POA: Diagnosis not present

## 2017-07-27 DIAGNOSIS — S3992XA Unspecified injury of lower back, initial encounter: Secondary | ICD-10-CM | POA: Diagnosis not present

## 2017-07-27 DIAGNOSIS — R296 Repeated falls: Secondary | ICD-10-CM | POA: Diagnosis present

## 2017-07-27 DIAGNOSIS — I10 Essential (primary) hypertension: Secondary | ICD-10-CM | POA: Diagnosis not present

## 2017-07-27 DIAGNOSIS — M1990 Unspecified osteoarthritis, unspecified site: Secondary | ICD-10-CM | POA: Diagnosis present

## 2017-07-27 DIAGNOSIS — R5381 Other malaise: Secondary | ICD-10-CM | POA: Diagnosis present

## 2017-07-27 DIAGNOSIS — R079 Chest pain, unspecified: Secondary | ICD-10-CM | POA: Diagnosis not present

## 2017-07-27 DIAGNOSIS — J449 Chronic obstructive pulmonary disease, unspecified: Secondary | ICD-10-CM | POA: Diagnosis not present

## 2017-07-27 DIAGNOSIS — F418 Other specified anxiety disorders: Secondary | ICD-10-CM | POA: Diagnosis present

## 2017-07-27 DIAGNOSIS — Z9981 Dependence on supplemental oxygen: Secondary | ICD-10-CM | POA: Diagnosis not present

## 2017-07-27 DIAGNOSIS — Z87828 Personal history of other (healed) physical injury and trauma: Secondary | ICD-10-CM | POA: Diagnosis not present

## 2017-07-27 DIAGNOSIS — K449 Diaphragmatic hernia without obstruction or gangrene: Secondary | ICD-10-CM | POA: Diagnosis not present

## 2017-07-27 DIAGNOSIS — Z6841 Body Mass Index (BMI) 40.0 and over, adult: Secondary | ICD-10-CM | POA: Diagnosis not present

## 2017-07-27 DIAGNOSIS — Z87891 Personal history of nicotine dependence: Secondary | ICD-10-CM | POA: Diagnosis not present

## 2017-07-27 DIAGNOSIS — Z9181 History of falling: Secondary | ICD-10-CM | POA: Diagnosis not present

## 2017-08-02 DIAGNOSIS — K2970 Gastritis, unspecified, without bleeding: Secondary | ICD-10-CM | POA: Diagnosis not present

## 2017-08-02 DIAGNOSIS — K449 Diaphragmatic hernia without obstruction or gangrene: Secondary | ICD-10-CM | POA: Diagnosis not present

## 2017-09-06 DIAGNOSIS — J9620 Acute and chronic respiratory failure, unspecified whether with hypoxia or hypercapnia: Secondary | ICD-10-CM | POA: Diagnosis not present

## 2017-09-06 DIAGNOSIS — E876 Hypokalemia: Secondary | ICD-10-CM | POA: Diagnosis not present

## 2017-09-06 DIAGNOSIS — R0602 Shortness of breath: Secondary | ICD-10-CM | POA: Diagnosis not present

## 2017-09-06 DIAGNOSIS — I160 Hypertensive urgency: Secondary | ICD-10-CM | POA: Diagnosis not present

## 2017-09-06 DIAGNOSIS — J9601 Acute respiratory failure with hypoxia: Secondary | ICD-10-CM | POA: Diagnosis not present

## 2017-09-06 DIAGNOSIS — I10 Essential (primary) hypertension: Secondary | ICD-10-CM | POA: Diagnosis not present

## 2017-09-06 DIAGNOSIS — E119 Type 2 diabetes mellitus without complications: Secondary | ICD-10-CM | POA: Diagnosis not present

## 2017-09-06 DIAGNOSIS — F329 Major depressive disorder, single episode, unspecified: Secondary | ICD-10-CM | POA: Diagnosis not present

## 2017-09-07 DIAGNOSIS — M4854XA Collapsed vertebra, not elsewhere classified, thoracic region, initial encounter for fracture: Secondary | ICD-10-CM | POA: Diagnosis present

## 2017-09-07 DIAGNOSIS — E1165 Type 2 diabetes mellitus with hyperglycemia: Secondary | ICD-10-CM | POA: Diagnosis present

## 2017-09-07 DIAGNOSIS — K2970 Gastritis, unspecified, without bleeding: Secondary | ICD-10-CM | POA: Diagnosis present

## 2017-09-07 DIAGNOSIS — J9620 Acute and chronic respiratory failure, unspecified whether with hypoxia or hypercapnia: Secondary | ICD-10-CM | POA: Diagnosis not present

## 2017-09-07 DIAGNOSIS — I10 Essential (primary) hypertension: Secondary | ICD-10-CM | POA: Diagnosis not present

## 2017-09-07 DIAGNOSIS — Z2821 Immunization not carried out because of patient refusal: Secondary | ICD-10-CM | POA: Diagnosis not present

## 2017-09-07 DIAGNOSIS — E1142 Type 2 diabetes mellitus with diabetic polyneuropathy: Secondary | ICD-10-CM | POA: Diagnosis present

## 2017-09-07 DIAGNOSIS — J9621 Acute and chronic respiratory failure with hypoxia: Secondary | ICD-10-CM | POA: Diagnosis not present

## 2017-09-07 DIAGNOSIS — R0602 Shortness of breath: Secondary | ICD-10-CM | POA: Diagnosis not present

## 2017-09-07 DIAGNOSIS — Z7984 Long term (current) use of oral hypoglycemic drugs: Secondary | ICD-10-CM | POA: Diagnosis not present

## 2017-09-07 DIAGNOSIS — E876 Hypokalemia: Secondary | ICD-10-CM | POA: Diagnosis present

## 2017-09-07 DIAGNOSIS — Z6841 Body Mass Index (BMI) 40.0 and over, adult: Secondary | ICD-10-CM | POA: Diagnosis not present

## 2017-09-07 DIAGNOSIS — R748 Abnormal levels of other serum enzymes: Secondary | ICD-10-CM | POA: Diagnosis present

## 2017-09-07 DIAGNOSIS — F418 Other specified anxiety disorders: Secondary | ICD-10-CM | POA: Diagnosis present

## 2017-09-07 DIAGNOSIS — E041 Nontoxic single thyroid nodule: Secondary | ICD-10-CM | POA: Diagnosis present

## 2017-09-07 DIAGNOSIS — J9601 Acute respiratory failure with hypoxia: Secondary | ICD-10-CM | POA: Diagnosis not present

## 2017-09-07 DIAGNOSIS — E119 Type 2 diabetes mellitus without complications: Secondary | ICD-10-CM | POA: Diagnosis present

## 2017-09-07 DIAGNOSIS — J449 Chronic obstructive pulmonary disease, unspecified: Secondary | ICD-10-CM | POA: Diagnosis present

## 2017-09-07 DIAGNOSIS — I5033 Acute on chronic diastolic (congestive) heart failure: Secondary | ICD-10-CM | POA: Diagnosis not present

## 2017-09-07 DIAGNOSIS — I160 Hypertensive urgency: Secondary | ICD-10-CM | POA: Diagnosis present

## 2017-09-07 DIAGNOSIS — M545 Low back pain: Secondary | ICD-10-CM | POA: Diagnosis present

## 2017-09-07 DIAGNOSIS — F329 Major depressive disorder, single episode, unspecified: Secondary | ICD-10-CM | POA: Diagnosis not present

## 2017-10-01 DIAGNOSIS — Z87891 Personal history of nicotine dependence: Secondary | ICD-10-CM | POA: Diagnosis not present

## 2017-10-01 DIAGNOSIS — E119 Type 2 diabetes mellitus without complications: Secondary | ICD-10-CM | POA: Diagnosis not present

## 2017-10-01 DIAGNOSIS — R0609 Other forms of dyspnea: Secondary | ICD-10-CM | POA: Diagnosis not present

## 2017-10-01 DIAGNOSIS — I10 Essential (primary) hypertension: Secondary | ICD-10-CM | POA: Diagnosis not present

## 2017-10-01 DIAGNOSIS — Z9981 Dependence on supplemental oxygen: Secondary | ICD-10-CM | POA: Diagnosis not present

## 2017-10-01 DIAGNOSIS — R0602 Shortness of breath: Secondary | ICD-10-CM | POA: Diagnosis not present

## 2017-10-01 DIAGNOSIS — J449 Chronic obstructive pulmonary disease, unspecified: Secondary | ICD-10-CM | POA: Diagnosis not present

## 2017-10-01 DIAGNOSIS — R0789 Other chest pain: Secondary | ICD-10-CM | POA: Diagnosis not present

## 2017-10-01 DIAGNOSIS — Z7984 Long term (current) use of oral hypoglycemic drugs: Secondary | ICD-10-CM | POA: Diagnosis not present

## 2017-10-01 DIAGNOSIS — G8929 Other chronic pain: Secondary | ICD-10-CM | POA: Diagnosis not present

## 2017-10-01 DIAGNOSIS — Z8679 Personal history of other diseases of the circulatory system: Secondary | ICD-10-CM | POA: Diagnosis not present

## 2017-10-01 DIAGNOSIS — F419 Anxiety disorder, unspecified: Secondary | ICD-10-CM | POA: Diagnosis not present

## 2017-10-01 DIAGNOSIS — Z79899 Other long term (current) drug therapy: Secondary | ICD-10-CM | POA: Diagnosis not present

## 2017-10-06 DIAGNOSIS — E1140 Type 2 diabetes mellitus with diabetic neuropathy, unspecified: Secondary | ICD-10-CM | POA: Diagnosis not present

## 2017-10-06 DIAGNOSIS — I10 Essential (primary) hypertension: Secondary | ICD-10-CM | POA: Diagnosis not present

## 2017-10-06 DIAGNOSIS — Z1339 Encounter for screening examination for other mental health and behavioral disorders: Secondary | ICD-10-CM | POA: Diagnosis not present

## 2017-10-06 DIAGNOSIS — R5382 Chronic fatigue, unspecified: Secondary | ICD-10-CM | POA: Diagnosis not present

## 2017-10-06 DIAGNOSIS — E785 Hyperlipidemia, unspecified: Secondary | ICD-10-CM | POA: Diagnosis not present

## 2017-10-06 DIAGNOSIS — E1142 Type 2 diabetes mellitus with diabetic polyneuropathy: Secondary | ICD-10-CM | POA: Diagnosis not present

## 2017-10-11 DIAGNOSIS — J309 Allergic rhinitis, unspecified: Secondary | ICD-10-CM | POA: Diagnosis not present

## 2017-10-11 DIAGNOSIS — F329 Major depressive disorder, single episode, unspecified: Secondary | ICD-10-CM | POA: Diagnosis not present

## 2017-10-11 DIAGNOSIS — R600 Localized edema: Secondary | ICD-10-CM | POA: Diagnosis not present

## 2017-10-11 DIAGNOSIS — E1140 Type 2 diabetes mellitus with diabetic neuropathy, unspecified: Secondary | ICD-10-CM | POA: Diagnosis not present

## 2017-11-07 DIAGNOSIS — F329 Major depressive disorder, single episode, unspecified: Secondary | ICD-10-CM | POA: Diagnosis not present

## 2017-11-07 DIAGNOSIS — J45909 Unspecified asthma, uncomplicated: Secondary | ICD-10-CM | POA: Diagnosis not present

## 2017-11-07 DIAGNOSIS — J309 Allergic rhinitis, unspecified: Secondary | ICD-10-CM | POA: Diagnosis not present

## 2017-11-07 DIAGNOSIS — E1140 Type 2 diabetes mellitus with diabetic neuropathy, unspecified: Secondary | ICD-10-CM | POA: Diagnosis not present

## 2017-11-21 DIAGNOSIS — F329 Major depressive disorder, single episode, unspecified: Secondary | ICD-10-CM | POA: Diagnosis not present

## 2017-11-21 DIAGNOSIS — J45909 Unspecified asthma, uncomplicated: Secondary | ICD-10-CM | POA: Diagnosis not present

## 2017-11-21 DIAGNOSIS — E1140 Type 2 diabetes mellitus with diabetic neuropathy, unspecified: Secondary | ICD-10-CM | POA: Diagnosis not present

## 2017-11-21 DIAGNOSIS — J309 Allergic rhinitis, unspecified: Secondary | ICD-10-CM | POA: Diagnosis not present

## 2017-11-28 DIAGNOSIS — F329 Major depressive disorder, single episode, unspecified: Secondary | ICD-10-CM | POA: Diagnosis not present

## 2017-11-28 DIAGNOSIS — J45909 Unspecified asthma, uncomplicated: Secondary | ICD-10-CM | POA: Diagnosis not present

## 2017-11-28 DIAGNOSIS — E1140 Type 2 diabetes mellitus with diabetic neuropathy, unspecified: Secondary | ICD-10-CM | POA: Diagnosis not present

## 2017-11-28 DIAGNOSIS — J309 Allergic rhinitis, unspecified: Secondary | ICD-10-CM | POA: Diagnosis not present

## 2017-12-05 DIAGNOSIS — F329 Major depressive disorder, single episode, unspecified: Secondary | ICD-10-CM | POA: Diagnosis not present

## 2017-12-05 DIAGNOSIS — J309 Allergic rhinitis, unspecified: Secondary | ICD-10-CM | POA: Diagnosis not present

## 2017-12-05 DIAGNOSIS — J45909 Unspecified asthma, uncomplicated: Secondary | ICD-10-CM | POA: Diagnosis not present

## 2017-12-05 DIAGNOSIS — E1140 Type 2 diabetes mellitus with diabetic neuropathy, unspecified: Secondary | ICD-10-CM | POA: Diagnosis not present

## 2017-12-19 DIAGNOSIS — E559 Vitamin D deficiency, unspecified: Secondary | ICD-10-CM | POA: Diagnosis not present

## 2017-12-19 DIAGNOSIS — E1140 Type 2 diabetes mellitus with diabetic neuropathy, unspecified: Secondary | ICD-10-CM | POA: Diagnosis not present

## 2017-12-19 DIAGNOSIS — J45909 Unspecified asthma, uncomplicated: Secondary | ICD-10-CM | POA: Diagnosis not present

## 2017-12-19 DIAGNOSIS — J309 Allergic rhinitis, unspecified: Secondary | ICD-10-CM | POA: Diagnosis not present

## 2018-01-03 DIAGNOSIS — E1142 Type 2 diabetes mellitus with diabetic polyneuropathy: Secondary | ICD-10-CM | POA: Diagnosis not present

## 2018-01-03 DIAGNOSIS — I10 Essential (primary) hypertension: Secondary | ICD-10-CM | POA: Diagnosis not present

## 2018-01-03 DIAGNOSIS — J309 Allergic rhinitis, unspecified: Secondary | ICD-10-CM | POA: Diagnosis not present

## 2018-01-03 DIAGNOSIS — Z23 Encounter for immunization: Secondary | ICD-10-CM | POA: Diagnosis not present

## 2018-01-03 DIAGNOSIS — K5792 Diverticulitis of intestine, part unspecified, without perforation or abscess without bleeding: Secondary | ICD-10-CM | POA: Diagnosis not present

## 2018-01-03 DIAGNOSIS — E785 Hyperlipidemia, unspecified: Secondary | ICD-10-CM | POA: Diagnosis not present

## 2018-01-03 DIAGNOSIS — E1140 Type 2 diabetes mellitus with diabetic neuropathy, unspecified: Secondary | ICD-10-CM | POA: Diagnosis not present

## 2018-01-09 DIAGNOSIS — K5792 Diverticulitis of intestine, part unspecified, without perforation or abscess without bleeding: Secondary | ICD-10-CM | POA: Diagnosis not present

## 2018-01-09 DIAGNOSIS — J309 Allergic rhinitis, unspecified: Secondary | ICD-10-CM | POA: Diagnosis not present

## 2018-01-09 DIAGNOSIS — Z1331 Encounter for screening for depression: Secondary | ICD-10-CM | POA: Diagnosis not present

## 2018-01-09 DIAGNOSIS — E1140 Type 2 diabetes mellitus with diabetic neuropathy, unspecified: Secondary | ICD-10-CM | POA: Diagnosis not present

## 2018-01-09 DIAGNOSIS — F5101 Primary insomnia: Secondary | ICD-10-CM | POA: Diagnosis not present

## 2018-01-18 DIAGNOSIS — J9690 Respiratory failure, unspecified, unspecified whether with hypoxia or hypercapnia: Secondary | ICD-10-CM | POA: Diagnosis not present

## 2018-01-22 DIAGNOSIS — J9621 Acute and chronic respiratory failure with hypoxia: Secondary | ICD-10-CM | POA: Diagnosis not present

## 2018-01-22 DIAGNOSIS — J449 Chronic obstructive pulmonary disease, unspecified: Secondary | ICD-10-CM | POA: Diagnosis not present

## 2018-02-02 DIAGNOSIS — J441 Chronic obstructive pulmonary disease with (acute) exacerbation: Secondary | ICD-10-CM | POA: Diagnosis not present

## 2018-02-02 DIAGNOSIS — J9690 Respiratory failure, unspecified, unspecified whether with hypoxia or hypercapnia: Secondary | ICD-10-CM | POA: Diagnosis not present

## 2018-02-18 DIAGNOSIS — J9690 Respiratory failure, unspecified, unspecified whether with hypoxia or hypercapnia: Secondary | ICD-10-CM | POA: Diagnosis not present

## 2018-02-22 DIAGNOSIS — J9621 Acute and chronic respiratory failure with hypoxia: Secondary | ICD-10-CM | POA: Diagnosis not present

## 2018-03-05 DIAGNOSIS — J9690 Respiratory failure, unspecified, unspecified whether with hypoxia or hypercapnia: Secondary | ICD-10-CM | POA: Diagnosis not present

## 2018-03-05 DIAGNOSIS — J441 Chronic obstructive pulmonary disease with (acute) exacerbation: Secondary | ICD-10-CM | POA: Diagnosis not present

## 2018-03-20 DIAGNOSIS — J9690 Respiratory failure, unspecified, unspecified whether with hypoxia or hypercapnia: Secondary | ICD-10-CM | POA: Diagnosis not present

## 2018-03-24 DIAGNOSIS — J9621 Acute and chronic respiratory failure with hypoxia: Secondary | ICD-10-CM | POA: Diagnosis not present

## 2018-03-27 DIAGNOSIS — J309 Allergic rhinitis, unspecified: Secondary | ICD-10-CM | POA: Diagnosis not present

## 2018-03-27 DIAGNOSIS — E559 Vitamin D deficiency, unspecified: Secondary | ICD-10-CM | POA: Diagnosis not present

## 2018-03-27 DIAGNOSIS — F5101 Primary insomnia: Secondary | ICD-10-CM | POA: Diagnosis not present

## 2018-03-27 DIAGNOSIS — E1140 Type 2 diabetes mellitus with diabetic neuropathy, unspecified: Secondary | ICD-10-CM | POA: Diagnosis not present

## 2018-04-04 DIAGNOSIS — J9690 Respiratory failure, unspecified, unspecified whether with hypoxia or hypercapnia: Secondary | ICD-10-CM | POA: Diagnosis not present

## 2018-04-04 DIAGNOSIS — J441 Chronic obstructive pulmonary disease with (acute) exacerbation: Secondary | ICD-10-CM | POA: Diagnosis not present

## 2018-04-20 DIAGNOSIS — J9690 Respiratory failure, unspecified, unspecified whether with hypoxia or hypercapnia: Secondary | ICD-10-CM | POA: Diagnosis not present

## 2018-04-24 DIAGNOSIS — J9621 Acute and chronic respiratory failure with hypoxia: Secondary | ICD-10-CM | POA: Diagnosis not present

## 2018-05-05 DIAGNOSIS — J441 Chronic obstructive pulmonary disease with (acute) exacerbation: Secondary | ICD-10-CM | POA: Diagnosis not present

## 2018-05-05 DIAGNOSIS — J9690 Respiratory failure, unspecified, unspecified whether with hypoxia or hypercapnia: Secondary | ICD-10-CM | POA: Diagnosis not present

## 2018-05-21 DIAGNOSIS — J9690 Respiratory failure, unspecified, unspecified whether with hypoxia or hypercapnia: Secondary | ICD-10-CM | POA: Diagnosis not present

## 2018-05-25 DIAGNOSIS — J9621 Acute and chronic respiratory failure with hypoxia: Secondary | ICD-10-CM | POA: Diagnosis not present

## 2018-06-19 DIAGNOSIS — J9690 Respiratory failure, unspecified, unspecified whether with hypoxia or hypercapnia: Secondary | ICD-10-CM | POA: Diagnosis not present

## 2018-06-23 DIAGNOSIS — J9621 Acute and chronic respiratory failure with hypoxia: Secondary | ICD-10-CM | POA: Diagnosis not present

## 2018-07-03 DIAGNOSIS — E1165 Type 2 diabetes mellitus with hyperglycemia: Secondary | ICD-10-CM | POA: Diagnosis not present

## 2018-07-03 DIAGNOSIS — G4733 Obstructive sleep apnea (adult) (pediatric): Secondary | ICD-10-CM | POA: Diagnosis not present

## 2018-07-03 DIAGNOSIS — J45998 Other asthma: Secondary | ICD-10-CM | POA: Diagnosis not present

## 2018-07-20 DIAGNOSIS — J9690 Respiratory failure, unspecified, unspecified whether with hypoxia or hypercapnia: Secondary | ICD-10-CM | POA: Diagnosis not present

## 2018-07-24 DIAGNOSIS — J9621 Acute and chronic respiratory failure with hypoxia: Secondary | ICD-10-CM | POA: Diagnosis not present

## 2018-08-19 DIAGNOSIS — J9690 Respiratory failure, unspecified, unspecified whether with hypoxia or hypercapnia: Secondary | ICD-10-CM | POA: Diagnosis not present

## 2018-08-23 DIAGNOSIS — J9621 Acute and chronic respiratory failure with hypoxia: Secondary | ICD-10-CM | POA: Diagnosis not present

## 2018-09-19 DIAGNOSIS — J9690 Respiratory failure, unspecified, unspecified whether with hypoxia or hypercapnia: Secondary | ICD-10-CM | POA: Diagnosis not present

## 2018-09-23 DIAGNOSIS — J9621 Acute and chronic respiratory failure with hypoxia: Secondary | ICD-10-CM | POA: Diagnosis not present

## 2018-10-12 DIAGNOSIS — E559 Vitamin D deficiency, unspecified: Secondary | ICD-10-CM | POA: Diagnosis not present

## 2018-10-12 DIAGNOSIS — J309 Allergic rhinitis, unspecified: Secondary | ICD-10-CM | POA: Diagnosis not present

## 2018-10-12 DIAGNOSIS — F5101 Primary insomnia: Secondary | ICD-10-CM | POA: Diagnosis not present

## 2018-10-12 DIAGNOSIS — E1140 Type 2 diabetes mellitus with diabetic neuropathy, unspecified: Secondary | ICD-10-CM | POA: Diagnosis not present

## 2018-10-19 DIAGNOSIS — J9690 Respiratory failure, unspecified, unspecified whether with hypoxia or hypercapnia: Secondary | ICD-10-CM | POA: Diagnosis not present

## 2018-10-23 DIAGNOSIS — J9621 Acute and chronic respiratory failure with hypoxia: Secondary | ICD-10-CM | POA: Diagnosis not present

## 2018-11-16 DIAGNOSIS — E1140 Type 2 diabetes mellitus with diabetic neuropathy, unspecified: Secondary | ICD-10-CM | POA: Diagnosis not present

## 2018-11-16 DIAGNOSIS — I10 Essential (primary) hypertension: Secondary | ICD-10-CM | POA: Diagnosis not present

## 2018-11-16 DIAGNOSIS — E785 Hyperlipidemia, unspecified: Secondary | ICD-10-CM | POA: Diagnosis not present

## 2018-11-16 DIAGNOSIS — E1142 Type 2 diabetes mellitus with diabetic polyneuropathy: Secondary | ICD-10-CM | POA: Diagnosis not present

## 2018-11-16 DIAGNOSIS — J309 Allergic rhinitis, unspecified: Secondary | ICD-10-CM | POA: Diagnosis not present

## 2018-11-16 DIAGNOSIS — M25561 Pain in right knee: Secondary | ICD-10-CM | POA: Diagnosis not present

## 2018-11-16 DIAGNOSIS — F5101 Primary insomnia: Secondary | ICD-10-CM | POA: Diagnosis not present

## 2018-11-19 DIAGNOSIS — J9690 Respiratory failure, unspecified, unspecified whether with hypoxia or hypercapnia: Secondary | ICD-10-CM | POA: Diagnosis not present

## 2018-11-23 DIAGNOSIS — J9621 Acute and chronic respiratory failure with hypoxia: Secondary | ICD-10-CM | POA: Diagnosis not present

## 2018-12-17 DIAGNOSIS — I10 Essential (primary) hypertension: Secondary | ICD-10-CM | POA: Diagnosis not present

## 2018-12-17 DIAGNOSIS — R197 Diarrhea, unspecified: Secondary | ICD-10-CM | POA: Diagnosis not present

## 2018-12-17 DIAGNOSIS — G8929 Other chronic pain: Secondary | ICD-10-CM | POA: Diagnosis not present

## 2018-12-17 DIAGNOSIS — Z87891 Personal history of nicotine dependence: Secondary | ICD-10-CM | POA: Diagnosis not present

## 2018-12-17 DIAGNOSIS — R079 Chest pain, unspecified: Secondary | ICD-10-CM | POA: Diagnosis not present

## 2018-12-17 DIAGNOSIS — R0600 Dyspnea, unspecified: Secondary | ICD-10-CM | POA: Diagnosis not present

## 2018-12-17 DIAGNOSIS — Z7984 Long term (current) use of oral hypoglycemic drugs: Secondary | ICD-10-CM | POA: Diagnosis not present

## 2018-12-17 DIAGNOSIS — R05 Cough: Secondary | ICD-10-CM | POA: Diagnosis not present

## 2018-12-17 DIAGNOSIS — E119 Type 2 diabetes mellitus without complications: Secondary | ICD-10-CM | POA: Diagnosis not present

## 2018-12-17 DIAGNOSIS — J069 Acute upper respiratory infection, unspecified: Secondary | ICD-10-CM | POA: Diagnosis not present

## 2018-12-17 DIAGNOSIS — M1990 Unspecified osteoarthritis, unspecified site: Secondary | ICD-10-CM | POA: Diagnosis not present

## 2018-12-17 DIAGNOSIS — Z79899 Other long term (current) drug therapy: Secondary | ICD-10-CM | POA: Diagnosis not present

## 2018-12-17 DIAGNOSIS — Z7952 Long term (current) use of systemic steroids: Secondary | ICD-10-CM | POA: Diagnosis not present

## 2018-12-17 DIAGNOSIS — R0602 Shortness of breath: Secondary | ICD-10-CM | POA: Diagnosis not present

## 2018-12-17 DIAGNOSIS — J449 Chronic obstructive pulmonary disease, unspecified: Secondary | ICD-10-CM | POA: Diagnosis not present

## 2018-12-20 DIAGNOSIS — J9690 Respiratory failure, unspecified, unspecified whether with hypoxia or hypercapnia: Secondary | ICD-10-CM | POA: Diagnosis not present

## 2018-12-24 DIAGNOSIS — J9621 Acute and chronic respiratory failure with hypoxia: Secondary | ICD-10-CM | POA: Diagnosis not present

## 2018-12-28 DIAGNOSIS — F5101 Primary insomnia: Secondary | ICD-10-CM | POA: Diagnosis not present

## 2018-12-28 DIAGNOSIS — E1140 Type 2 diabetes mellitus with diabetic neuropathy, unspecified: Secondary | ICD-10-CM | POA: Diagnosis not present

## 2018-12-28 DIAGNOSIS — J309 Allergic rhinitis, unspecified: Secondary | ICD-10-CM | POA: Diagnosis not present

## 2018-12-28 DIAGNOSIS — J208 Acute bronchitis due to other specified organisms: Secondary | ICD-10-CM | POA: Diagnosis not present

## 2019-01-11 DIAGNOSIS — Z9989 Dependence on other enabling machines and devices: Secondary | ICD-10-CM | POA: Diagnosis not present

## 2019-01-11 DIAGNOSIS — M159 Polyosteoarthritis, unspecified: Secondary | ICD-10-CM | POA: Diagnosis not present

## 2019-01-11 DIAGNOSIS — G4733 Obstructive sleep apnea (adult) (pediatric): Secondary | ICD-10-CM | POA: Diagnosis not present

## 2019-01-11 DIAGNOSIS — J309 Allergic rhinitis, unspecified: Secondary | ICD-10-CM | POA: Diagnosis not present

## 2019-01-11 DIAGNOSIS — I872 Venous insufficiency (chronic) (peripheral): Secondary | ICD-10-CM | POA: Diagnosis not present

## 2019-01-11 DIAGNOSIS — E1140 Type 2 diabetes mellitus with diabetic neuropathy, unspecified: Secondary | ICD-10-CM | POA: Diagnosis not present

## 2019-01-11 DIAGNOSIS — E559 Vitamin D deficiency, unspecified: Secondary | ICD-10-CM | POA: Diagnosis not present

## 2019-01-11 DIAGNOSIS — F5101 Primary insomnia: Secondary | ICD-10-CM | POA: Diagnosis not present

## 2019-01-11 DIAGNOSIS — J9621 Acute and chronic respiratory failure with hypoxia: Secondary | ICD-10-CM | POA: Diagnosis not present

## 2019-01-11 DIAGNOSIS — F329 Major depressive disorder, single episode, unspecified: Secondary | ICD-10-CM | POA: Diagnosis not present

## 2019-01-23 DIAGNOSIS — J9621 Acute and chronic respiratory failure with hypoxia: Secondary | ICD-10-CM | POA: Diagnosis not present

## 2019-02-19 DIAGNOSIS — J9690 Respiratory failure, unspecified, unspecified whether with hypoxia or hypercapnia: Secondary | ICD-10-CM | POA: Diagnosis not present

## 2019-02-22 DIAGNOSIS — F329 Major depressive disorder, single episode, unspecified: Secondary | ICD-10-CM | POA: Diagnosis not present

## 2019-02-22 DIAGNOSIS — J309 Allergic rhinitis, unspecified: Secondary | ICD-10-CM | POA: Diagnosis not present

## 2019-02-22 DIAGNOSIS — F5101 Primary insomnia: Secondary | ICD-10-CM | POA: Diagnosis not present

## 2019-02-22 DIAGNOSIS — E1140 Type 2 diabetes mellitus with diabetic neuropathy, unspecified: Secondary | ICD-10-CM | POA: Diagnosis not present

## 2019-02-22 DIAGNOSIS — N3281 Overactive bladder: Secondary | ICD-10-CM | POA: Diagnosis not present

## 2019-02-22 DIAGNOSIS — M159 Polyosteoarthritis, unspecified: Secondary | ICD-10-CM | POA: Diagnosis not present

## 2019-02-22 DIAGNOSIS — I872 Venous insufficiency (chronic) (peripheral): Secondary | ICD-10-CM | POA: Diagnosis not present

## 2019-02-22 DIAGNOSIS — E559 Vitamin D deficiency, unspecified: Secondary | ICD-10-CM | POA: Diagnosis not present

## 2019-02-22 DIAGNOSIS — G4733 Obstructive sleep apnea (adult) (pediatric): Secondary | ICD-10-CM | POA: Diagnosis not present

## 2019-02-23 DIAGNOSIS — J9621 Acute and chronic respiratory failure with hypoxia: Secondary | ICD-10-CM | POA: Diagnosis not present

## 2019-03-21 DIAGNOSIS — J9690 Respiratory failure, unspecified, unspecified whether with hypoxia or hypercapnia: Secondary | ICD-10-CM | POA: Diagnosis not present

## 2019-03-25 DIAGNOSIS — J9621 Acute and chronic respiratory failure with hypoxia: Secondary | ICD-10-CM | POA: Diagnosis not present

## 2019-04-21 DIAGNOSIS — J9690 Respiratory failure, unspecified, unspecified whether with hypoxia or hypercapnia: Secondary | ICD-10-CM | POA: Diagnosis not present

## 2019-04-25 DIAGNOSIS — J9621 Acute and chronic respiratory failure with hypoxia: Secondary | ICD-10-CM | POA: Diagnosis not present

## 2019-05-04 DIAGNOSIS — N3281 Overactive bladder: Secondary | ICD-10-CM | POA: Diagnosis not present

## 2019-05-04 DIAGNOSIS — E559 Vitamin D deficiency, unspecified: Secondary | ICD-10-CM | POA: Diagnosis not present

## 2019-05-04 DIAGNOSIS — G4733 Obstructive sleep apnea (adult) (pediatric): Secondary | ICD-10-CM | POA: Diagnosis not present

## 2019-05-04 DIAGNOSIS — F329 Major depressive disorder, single episode, unspecified: Secondary | ICD-10-CM | POA: Diagnosis not present

## 2019-05-04 DIAGNOSIS — E1140 Type 2 diabetes mellitus with diabetic neuropathy, unspecified: Secondary | ICD-10-CM | POA: Diagnosis not present

## 2019-05-04 DIAGNOSIS — J309 Allergic rhinitis, unspecified: Secondary | ICD-10-CM | POA: Diagnosis not present

## 2019-05-04 DIAGNOSIS — M159 Polyosteoarthritis, unspecified: Secondary | ICD-10-CM | POA: Diagnosis not present

## 2019-05-04 DIAGNOSIS — I872 Venous insufficiency (chronic) (peripheral): Secondary | ICD-10-CM | POA: Diagnosis not present

## 2019-05-04 DIAGNOSIS — F5101 Primary insomnia: Secondary | ICD-10-CM | POA: Diagnosis not present

## 2019-05-22 DIAGNOSIS — J9690 Respiratory failure, unspecified, unspecified whether with hypoxia or hypercapnia: Secondary | ICD-10-CM | POA: Diagnosis not present

## 2019-05-26 DIAGNOSIS — J9621 Acute and chronic respiratory failure with hypoxia: Secondary | ICD-10-CM | POA: Diagnosis not present

## 2019-06-01 DIAGNOSIS — J309 Allergic rhinitis, unspecified: Secondary | ICD-10-CM | POA: Diagnosis not present

## 2019-06-01 DIAGNOSIS — G4733 Obstructive sleep apnea (adult) (pediatric): Secondary | ICD-10-CM | POA: Diagnosis not present

## 2019-06-01 DIAGNOSIS — I872 Venous insufficiency (chronic) (peripheral): Secondary | ICD-10-CM | POA: Diagnosis not present

## 2019-06-01 DIAGNOSIS — F3341 Major depressive disorder, recurrent, in partial remission: Secondary | ICD-10-CM | POA: Diagnosis not present

## 2019-06-01 DIAGNOSIS — E1140 Type 2 diabetes mellitus with diabetic neuropathy, unspecified: Secondary | ICD-10-CM | POA: Diagnosis not present

## 2019-06-01 DIAGNOSIS — M159 Polyosteoarthritis, unspecified: Secondary | ICD-10-CM | POA: Diagnosis not present

## 2019-06-01 DIAGNOSIS — E559 Vitamin D deficiency, unspecified: Secondary | ICD-10-CM | POA: Diagnosis not present

## 2019-06-01 DIAGNOSIS — N3281 Overactive bladder: Secondary | ICD-10-CM | POA: Diagnosis not present

## 2019-06-01 DIAGNOSIS — F5101 Primary insomnia: Secondary | ICD-10-CM | POA: Diagnosis not present

## 2019-06-08 DIAGNOSIS — G4733 Obstructive sleep apnea (adult) (pediatric): Secondary | ICD-10-CM | POA: Diagnosis not present

## 2019-06-08 DIAGNOSIS — F5101 Primary insomnia: Secondary | ICD-10-CM | POA: Diagnosis not present

## 2019-06-08 DIAGNOSIS — Z1231 Encounter for screening mammogram for malignant neoplasm of breast: Secondary | ICD-10-CM | POA: Diagnosis not present

## 2019-06-08 DIAGNOSIS — Z1331 Encounter for screening for depression: Secondary | ICD-10-CM | POA: Diagnosis not present

## 2019-06-08 DIAGNOSIS — Z0000 Encounter for general adult medical examination without abnormal findings: Secondary | ICD-10-CM | POA: Diagnosis not present

## 2019-06-08 DIAGNOSIS — Z1211 Encounter for screening for malignant neoplasm of colon: Secondary | ICD-10-CM | POA: Diagnosis not present

## 2019-06-08 DIAGNOSIS — E1140 Type 2 diabetes mellitus with diabetic neuropathy, unspecified: Secondary | ICD-10-CM | POA: Diagnosis not present

## 2019-06-08 DIAGNOSIS — J309 Allergic rhinitis, unspecified: Secondary | ICD-10-CM | POA: Diagnosis not present

## 2019-06-08 DIAGNOSIS — Z9181 History of falling: Secondary | ICD-10-CM | POA: Diagnosis not present

## 2019-06-15 DIAGNOSIS — E1140 Type 2 diabetes mellitus with diabetic neuropathy, unspecified: Secondary | ICD-10-CM | POA: Diagnosis not present

## 2019-06-15 DIAGNOSIS — F3341 Major depressive disorder, recurrent, in partial remission: Secondary | ICD-10-CM | POA: Diagnosis not present

## 2019-06-15 DIAGNOSIS — I872 Venous insufficiency (chronic) (peripheral): Secondary | ICD-10-CM | POA: Diagnosis not present

## 2019-06-15 DIAGNOSIS — F5101 Primary insomnia: Secondary | ICD-10-CM | POA: Diagnosis not present

## 2019-06-15 DIAGNOSIS — M159 Polyosteoarthritis, unspecified: Secondary | ICD-10-CM | POA: Diagnosis not present

## 2019-06-15 DIAGNOSIS — J309 Allergic rhinitis, unspecified: Secondary | ICD-10-CM | POA: Diagnosis not present

## 2019-06-15 DIAGNOSIS — E559 Vitamin D deficiency, unspecified: Secondary | ICD-10-CM | POA: Diagnosis not present

## 2019-06-15 DIAGNOSIS — E1142 Type 2 diabetes mellitus with diabetic polyneuropathy: Secondary | ICD-10-CM | POA: Diagnosis not present

## 2019-06-15 DIAGNOSIS — G4733 Obstructive sleep apnea (adult) (pediatric): Secondary | ICD-10-CM | POA: Diagnosis not present

## 2019-06-19 DIAGNOSIS — J9690 Respiratory failure, unspecified, unspecified whether with hypoxia or hypercapnia: Secondary | ICD-10-CM | POA: Diagnosis not present

## 2019-06-23 DIAGNOSIS — J9621 Acute and chronic respiratory failure with hypoxia: Secondary | ICD-10-CM | POA: Diagnosis not present

## 2019-06-29 DIAGNOSIS — M159 Polyosteoarthritis, unspecified: Secondary | ICD-10-CM | POA: Diagnosis not present

## 2019-06-29 DIAGNOSIS — G4733 Obstructive sleep apnea (adult) (pediatric): Secondary | ICD-10-CM | POA: Diagnosis not present

## 2019-06-29 DIAGNOSIS — F3341 Major depressive disorder, recurrent, in partial remission: Secondary | ICD-10-CM | POA: Diagnosis not present

## 2019-06-29 DIAGNOSIS — F5101 Primary insomnia: Secondary | ICD-10-CM | POA: Diagnosis not present

## 2019-06-29 DIAGNOSIS — I872 Venous insufficiency (chronic) (peripheral): Secondary | ICD-10-CM | POA: Diagnosis not present

## 2019-06-29 DIAGNOSIS — J309 Allergic rhinitis, unspecified: Secondary | ICD-10-CM | POA: Diagnosis not present

## 2019-06-29 DIAGNOSIS — E559 Vitamin D deficiency, unspecified: Secondary | ICD-10-CM | POA: Diagnosis not present

## 2019-06-29 DIAGNOSIS — E1142 Type 2 diabetes mellitus with diabetic polyneuropathy: Secondary | ICD-10-CM | POA: Diagnosis not present

## 2019-06-29 DIAGNOSIS — E1140 Type 2 diabetes mellitus with diabetic neuropathy, unspecified: Secondary | ICD-10-CM | POA: Diagnosis not present

## 2019-07-20 DIAGNOSIS — J9690 Respiratory failure, unspecified, unspecified whether with hypoxia or hypercapnia: Secondary | ICD-10-CM | POA: Diagnosis not present

## 2019-07-24 DIAGNOSIS — J9621 Acute and chronic respiratory failure with hypoxia: Secondary | ICD-10-CM | POA: Diagnosis not present

## 2019-08-16 DIAGNOSIS — B3789 Other sites of candidiasis: Secondary | ICD-10-CM | POA: Diagnosis not present

## 2019-08-16 DIAGNOSIS — B372 Candidiasis of skin and nail: Secondary | ICD-10-CM | POA: Diagnosis not present

## 2019-08-19 DIAGNOSIS — J9690 Respiratory failure, unspecified, unspecified whether with hypoxia or hypercapnia: Secondary | ICD-10-CM | POA: Diagnosis not present

## 2019-08-23 DIAGNOSIS — J9621 Acute and chronic respiratory failure with hypoxia: Secondary | ICD-10-CM | POA: Diagnosis not present

## 2019-09-13 DIAGNOSIS — M159 Polyosteoarthritis, unspecified: Secondary | ICD-10-CM | POA: Diagnosis not present

## 2019-09-13 DIAGNOSIS — E559 Vitamin D deficiency, unspecified: Secondary | ICD-10-CM | POA: Diagnosis not present

## 2019-09-13 DIAGNOSIS — E1140 Type 2 diabetes mellitus with diabetic neuropathy, unspecified: Secondary | ICD-10-CM | POA: Diagnosis not present

## 2019-09-13 DIAGNOSIS — G4733 Obstructive sleep apnea (adult) (pediatric): Secondary | ICD-10-CM | POA: Diagnosis not present

## 2019-09-13 DIAGNOSIS — M25561 Pain in right knee: Secondary | ICD-10-CM | POA: Diagnosis not present

## 2019-09-13 DIAGNOSIS — B359 Dermatophytosis, unspecified: Secondary | ICD-10-CM | POA: Diagnosis not present

## 2019-09-13 DIAGNOSIS — E785 Hyperlipidemia, unspecified: Secondary | ICD-10-CM | POA: Diagnosis not present

## 2019-09-13 DIAGNOSIS — E1142 Type 2 diabetes mellitus with diabetic polyneuropathy: Secondary | ICD-10-CM | POA: Diagnosis not present

## 2019-09-13 DIAGNOSIS — I10 Essential (primary) hypertension: Secondary | ICD-10-CM | POA: Diagnosis not present

## 2019-09-13 DIAGNOSIS — J309 Allergic rhinitis, unspecified: Secondary | ICD-10-CM | POA: Diagnosis not present

## 2019-09-13 DIAGNOSIS — R69 Illness, unspecified: Secondary | ICD-10-CM | POA: Diagnosis not present

## 2019-09-19 DIAGNOSIS — E1142 Type 2 diabetes mellitus with diabetic polyneuropathy: Secondary | ICD-10-CM | POA: Diagnosis not present

## 2019-09-19 DIAGNOSIS — M159 Polyosteoarthritis, unspecified: Secondary | ICD-10-CM | POA: Diagnosis not present

## 2019-09-19 DIAGNOSIS — E1140 Type 2 diabetes mellitus with diabetic neuropathy, unspecified: Secondary | ICD-10-CM | POA: Diagnosis not present

## 2019-09-19 DIAGNOSIS — J9690 Respiratory failure, unspecified, unspecified whether with hypoxia or hypercapnia: Secondary | ICD-10-CM | POA: Diagnosis not present

## 2019-09-19 DIAGNOSIS — I872 Venous insufficiency (chronic) (peripheral): Secondary | ICD-10-CM | POA: Diagnosis not present

## 2019-09-19 DIAGNOSIS — G4733 Obstructive sleep apnea (adult) (pediatric): Secondary | ICD-10-CM | POA: Diagnosis not present

## 2019-09-19 DIAGNOSIS — R69 Illness, unspecified: Secondary | ICD-10-CM | POA: Diagnosis not present

## 2019-09-19 DIAGNOSIS — Z9989 Dependence on other enabling machines and devices: Secondary | ICD-10-CM | POA: Diagnosis not present

## 2019-09-19 DIAGNOSIS — J309 Allergic rhinitis, unspecified: Secondary | ICD-10-CM | POA: Diagnosis not present

## 2019-09-19 DIAGNOSIS — E559 Vitamin D deficiency, unspecified: Secondary | ICD-10-CM | POA: Diagnosis not present

## 2019-09-23 DIAGNOSIS — J9621 Acute and chronic respiratory failure with hypoxia: Secondary | ICD-10-CM | POA: Diagnosis not present

## 2019-09-25 DIAGNOSIS — R69 Illness, unspecified: Secondary | ICD-10-CM | POA: Diagnosis not present

## 2019-09-25 DIAGNOSIS — J309 Allergic rhinitis, unspecified: Secondary | ICD-10-CM | POA: Diagnosis not present

## 2019-09-25 DIAGNOSIS — M159 Polyosteoarthritis, unspecified: Secondary | ICD-10-CM | POA: Diagnosis not present

## 2019-09-25 DIAGNOSIS — M25561 Pain in right knee: Secondary | ICD-10-CM | POA: Diagnosis not present

## 2019-09-25 DIAGNOSIS — E1140 Type 2 diabetes mellitus with diabetic neuropathy, unspecified: Secondary | ICD-10-CM | POA: Diagnosis not present

## 2019-09-25 DIAGNOSIS — G4733 Obstructive sleep apnea (adult) (pediatric): Secondary | ICD-10-CM | POA: Diagnosis not present

## 2019-09-25 DIAGNOSIS — E1142 Type 2 diabetes mellitus with diabetic polyneuropathy: Secondary | ICD-10-CM | POA: Diagnosis not present

## 2019-09-25 DIAGNOSIS — E559 Vitamin D deficiency, unspecified: Secondary | ICD-10-CM | POA: Diagnosis not present

## 2019-09-25 DIAGNOSIS — Z9989 Dependence on other enabling machines and devices: Secondary | ICD-10-CM | POA: Diagnosis not present

## 2019-10-19 DIAGNOSIS — J9690 Respiratory failure, unspecified, unspecified whether with hypoxia or hypercapnia: Secondary | ICD-10-CM | POA: Diagnosis not present

## 2019-10-23 DIAGNOSIS — J9621 Acute and chronic respiratory failure with hypoxia: Secondary | ICD-10-CM | POA: Diagnosis not present

## 2019-11-14 DIAGNOSIS — E1142 Type 2 diabetes mellitus with diabetic polyneuropathy: Secondary | ICD-10-CM | POA: Diagnosis not present

## 2019-11-14 DIAGNOSIS — J309 Allergic rhinitis, unspecified: Secondary | ICD-10-CM | POA: Diagnosis not present

## 2019-11-14 DIAGNOSIS — R69 Illness, unspecified: Secondary | ICD-10-CM | POA: Diagnosis not present

## 2019-11-14 DIAGNOSIS — M159 Polyosteoarthritis, unspecified: Secondary | ICD-10-CM | POA: Diagnosis not present

## 2019-11-14 DIAGNOSIS — G4733 Obstructive sleep apnea (adult) (pediatric): Secondary | ICD-10-CM | POA: Diagnosis not present

## 2019-11-14 DIAGNOSIS — I872 Venous insufficiency (chronic) (peripheral): Secondary | ICD-10-CM | POA: Diagnosis not present

## 2019-11-14 DIAGNOSIS — E559 Vitamin D deficiency, unspecified: Secondary | ICD-10-CM | POA: Diagnosis not present

## 2019-11-14 DIAGNOSIS — E1140 Type 2 diabetes mellitus with diabetic neuropathy, unspecified: Secondary | ICD-10-CM | POA: Diagnosis not present

## 2019-11-14 DIAGNOSIS — Z9989 Dependence on other enabling machines and devices: Secondary | ICD-10-CM | POA: Diagnosis not present

## 2019-11-19 DIAGNOSIS — J9690 Respiratory failure, unspecified, unspecified whether with hypoxia or hypercapnia: Secondary | ICD-10-CM | POA: Diagnosis not present

## 2019-11-23 DIAGNOSIS — J9621 Acute and chronic respiratory failure with hypoxia: Secondary | ICD-10-CM | POA: Diagnosis not present

## 2019-11-28 DIAGNOSIS — G4733 Obstructive sleep apnea (adult) (pediatric): Secondary | ICD-10-CM | POA: Diagnosis not present

## 2019-11-28 DIAGNOSIS — E1140 Type 2 diabetes mellitus with diabetic neuropathy, unspecified: Secondary | ICD-10-CM | POA: Diagnosis not present

## 2019-11-28 DIAGNOSIS — R69 Illness, unspecified: Secondary | ICD-10-CM | POA: Diagnosis not present

## 2019-11-28 DIAGNOSIS — J0100 Acute maxillary sinusitis, unspecified: Secondary | ICD-10-CM | POA: Diagnosis not present

## 2019-11-28 DIAGNOSIS — N390 Urinary tract infection, site not specified: Secondary | ICD-10-CM | POA: Diagnosis not present

## 2019-11-28 DIAGNOSIS — Z9989 Dependence on other enabling machines and devices: Secondary | ICD-10-CM | POA: Diagnosis not present

## 2019-11-28 DIAGNOSIS — E559 Vitamin D deficiency, unspecified: Secondary | ICD-10-CM | POA: Diagnosis not present

## 2019-11-28 DIAGNOSIS — J309 Allergic rhinitis, unspecified: Secondary | ICD-10-CM | POA: Diagnosis not present

## 2019-11-28 DIAGNOSIS — E1142 Type 2 diabetes mellitus with diabetic polyneuropathy: Secondary | ICD-10-CM | POA: Diagnosis not present

## 2019-12-05 DIAGNOSIS — E559 Vitamin D deficiency, unspecified: Secondary | ICD-10-CM | POA: Diagnosis not present

## 2019-12-05 DIAGNOSIS — M159 Polyosteoarthritis, unspecified: Secondary | ICD-10-CM | POA: Diagnosis not present

## 2019-12-05 DIAGNOSIS — J309 Allergic rhinitis, unspecified: Secondary | ICD-10-CM | POA: Diagnosis not present

## 2019-12-05 DIAGNOSIS — R69 Illness, unspecified: Secondary | ICD-10-CM | POA: Diagnosis not present

## 2019-12-05 DIAGNOSIS — Z9989 Dependence on other enabling machines and devices: Secondary | ICD-10-CM | POA: Diagnosis not present

## 2019-12-05 DIAGNOSIS — I872 Venous insufficiency (chronic) (peripheral): Secondary | ICD-10-CM | POA: Diagnosis not present

## 2019-12-05 DIAGNOSIS — E1142 Type 2 diabetes mellitus with diabetic polyneuropathy: Secondary | ICD-10-CM | POA: Diagnosis not present

## 2019-12-05 DIAGNOSIS — G4733 Obstructive sleep apnea (adult) (pediatric): Secondary | ICD-10-CM | POA: Diagnosis not present

## 2019-12-05 DIAGNOSIS — N3281 Overactive bladder: Secondary | ICD-10-CM | POA: Diagnosis not present

## 2019-12-05 DIAGNOSIS — E1140 Type 2 diabetes mellitus with diabetic neuropathy, unspecified: Secondary | ICD-10-CM | POA: Diagnosis not present

## 2019-12-11 DIAGNOSIS — E1140 Type 2 diabetes mellitus with diabetic neuropathy, unspecified: Secondary | ICD-10-CM | POA: Diagnosis not present

## 2019-12-11 DIAGNOSIS — M159 Polyosteoarthritis, unspecified: Secondary | ICD-10-CM | POA: Diagnosis not present

## 2019-12-11 DIAGNOSIS — J309 Allergic rhinitis, unspecified: Secondary | ICD-10-CM | POA: Diagnosis not present

## 2019-12-11 DIAGNOSIS — N3281 Overactive bladder: Secondary | ICD-10-CM | POA: Diagnosis not present

## 2019-12-11 DIAGNOSIS — E1142 Type 2 diabetes mellitus with diabetic polyneuropathy: Secondary | ICD-10-CM | POA: Diagnosis not present

## 2019-12-11 DIAGNOSIS — E559 Vitamin D deficiency, unspecified: Secondary | ICD-10-CM | POA: Diagnosis not present

## 2019-12-11 DIAGNOSIS — I872 Venous insufficiency (chronic) (peripheral): Secondary | ICD-10-CM | POA: Diagnosis not present

## 2019-12-11 DIAGNOSIS — G4733 Obstructive sleep apnea (adult) (pediatric): Secondary | ICD-10-CM | POA: Diagnosis not present

## 2019-12-11 DIAGNOSIS — Z9989 Dependence on other enabling machines and devices: Secondary | ICD-10-CM | POA: Diagnosis not present

## 2019-12-11 DIAGNOSIS — R69 Illness, unspecified: Secondary | ICD-10-CM | POA: Diagnosis not present

## 2019-12-20 DIAGNOSIS — J9690 Respiratory failure, unspecified, unspecified whether with hypoxia or hypercapnia: Secondary | ICD-10-CM | POA: Diagnosis not present

## 2019-12-24 DIAGNOSIS — J9621 Acute and chronic respiratory failure with hypoxia: Secondary | ICD-10-CM | POA: Diagnosis not present

## 2020-01-08 DIAGNOSIS — E1140 Type 2 diabetes mellitus with diabetic neuropathy, unspecified: Secondary | ICD-10-CM | POA: Diagnosis not present

## 2020-01-08 DIAGNOSIS — J309 Allergic rhinitis, unspecified: Secondary | ICD-10-CM | POA: Diagnosis not present

## 2020-01-08 DIAGNOSIS — M159 Polyosteoarthritis, unspecified: Secondary | ICD-10-CM | POA: Diagnosis not present

## 2020-01-08 DIAGNOSIS — E1142 Type 2 diabetes mellitus with diabetic polyneuropathy: Secondary | ICD-10-CM | POA: Diagnosis not present

## 2020-01-08 DIAGNOSIS — R69 Illness, unspecified: Secondary | ICD-10-CM | POA: Diagnosis not present

## 2020-01-08 DIAGNOSIS — I872 Venous insufficiency (chronic) (peripheral): Secondary | ICD-10-CM | POA: Diagnosis not present

## 2020-01-08 DIAGNOSIS — E559 Vitamin D deficiency, unspecified: Secondary | ICD-10-CM | POA: Diagnosis not present

## 2020-01-08 DIAGNOSIS — N3281 Overactive bladder: Secondary | ICD-10-CM | POA: Diagnosis not present

## 2020-01-08 DIAGNOSIS — Z9989 Dependence on other enabling machines and devices: Secondary | ICD-10-CM | POA: Diagnosis not present

## 2020-01-08 DIAGNOSIS — G4733 Obstructive sleep apnea (adult) (pediatric): Secondary | ICD-10-CM | POA: Diagnosis not present

## 2020-01-19 DIAGNOSIS — J9690 Respiratory failure, unspecified, unspecified whether with hypoxia or hypercapnia: Secondary | ICD-10-CM | POA: Diagnosis not present

## 2020-01-23 DIAGNOSIS — J9621 Acute and chronic respiratory failure with hypoxia: Secondary | ICD-10-CM | POA: Diagnosis not present

## 2020-02-05 DIAGNOSIS — E559 Vitamin D deficiency, unspecified: Secondary | ICD-10-CM | POA: Diagnosis not present

## 2020-02-05 DIAGNOSIS — G4733 Obstructive sleep apnea (adult) (pediatric): Secondary | ICD-10-CM | POA: Diagnosis not present

## 2020-02-05 DIAGNOSIS — F5101 Primary insomnia: Secondary | ICD-10-CM | POA: Diagnosis not present

## 2020-02-05 DIAGNOSIS — J449 Chronic obstructive pulmonary disease, unspecified: Secondary | ICD-10-CM | POA: Diagnosis not present

## 2020-02-05 DIAGNOSIS — R110 Nausea: Secondary | ICD-10-CM | POA: Diagnosis not present

## 2020-02-05 DIAGNOSIS — F419 Anxiety disorder, unspecified: Secondary | ICD-10-CM | POA: Diagnosis not present

## 2020-02-05 DIAGNOSIS — J309 Allergic rhinitis, unspecified: Secondary | ICD-10-CM | POA: Diagnosis not present

## 2020-02-05 DIAGNOSIS — E1140 Type 2 diabetes mellitus with diabetic neuropathy, unspecified: Secondary | ICD-10-CM | POA: Diagnosis not present

## 2020-02-05 DIAGNOSIS — E1142 Type 2 diabetes mellitus with diabetic polyneuropathy: Secondary | ICD-10-CM | POA: Diagnosis not present

## 2020-02-05 DIAGNOSIS — F3341 Major depressive disorder, recurrent, in partial remission: Secondary | ICD-10-CM | POA: Diagnosis not present

## 2020-02-19 DIAGNOSIS — J9690 Respiratory failure, unspecified, unspecified whether with hypoxia or hypercapnia: Secondary | ICD-10-CM | POA: Diagnosis not present

## 2020-02-23 DIAGNOSIS — J9621 Acute and chronic respiratory failure with hypoxia: Secondary | ICD-10-CM | POA: Diagnosis not present

## 2020-03-04 DIAGNOSIS — J309 Allergic rhinitis, unspecified: Secondary | ICD-10-CM | POA: Diagnosis not present

## 2020-03-04 DIAGNOSIS — E1142 Type 2 diabetes mellitus with diabetic polyneuropathy: Secondary | ICD-10-CM | POA: Diagnosis not present

## 2020-03-04 DIAGNOSIS — E1140 Type 2 diabetes mellitus with diabetic neuropathy, unspecified: Secondary | ICD-10-CM | POA: Diagnosis not present

## 2020-03-04 DIAGNOSIS — Z9989 Dependence on other enabling machines and devices: Secondary | ICD-10-CM | POA: Diagnosis not present

## 2020-03-04 DIAGNOSIS — F419 Anxiety disorder, unspecified: Secondary | ICD-10-CM | POA: Diagnosis not present

## 2020-03-04 DIAGNOSIS — F3341 Major depressive disorder, recurrent, in partial remission: Secondary | ICD-10-CM | POA: Diagnosis not present

## 2020-03-04 DIAGNOSIS — E559 Vitamin D deficiency, unspecified: Secondary | ICD-10-CM | POA: Diagnosis not present

## 2020-03-04 DIAGNOSIS — F5101 Primary insomnia: Secondary | ICD-10-CM | POA: Diagnosis not present

## 2020-03-04 DIAGNOSIS — G4733 Obstructive sleep apnea (adult) (pediatric): Secondary | ICD-10-CM | POA: Diagnosis not present

## 2020-03-20 DIAGNOSIS — J9690 Respiratory failure, unspecified, unspecified whether with hypoxia or hypercapnia: Secondary | ICD-10-CM | POA: Diagnosis not present

## 2020-03-24 DIAGNOSIS — J9621 Acute and chronic respiratory failure with hypoxia: Secondary | ICD-10-CM | POA: Diagnosis not present

## 2020-04-20 DIAGNOSIS — J9690 Respiratory failure, unspecified, unspecified whether with hypoxia or hypercapnia: Secondary | ICD-10-CM | POA: Diagnosis not present

## 2020-04-24 DIAGNOSIS — J9621 Acute and chronic respiratory failure with hypoxia: Secondary | ICD-10-CM | POA: Diagnosis not present

## 2020-05-21 DIAGNOSIS — J9690 Respiratory failure, unspecified, unspecified whether with hypoxia or hypercapnia: Secondary | ICD-10-CM | POA: Diagnosis not present

## 2020-05-25 DIAGNOSIS — J9621 Acute and chronic respiratory failure with hypoxia: Secondary | ICD-10-CM | POA: Diagnosis not present

## 2020-06-18 DIAGNOSIS — J9690 Respiratory failure, unspecified, unspecified whether with hypoxia or hypercapnia: Secondary | ICD-10-CM | POA: Diagnosis not present

## 2020-06-22 DIAGNOSIS — J9621 Acute and chronic respiratory failure with hypoxia: Secondary | ICD-10-CM | POA: Diagnosis not present

## 2020-07-19 DIAGNOSIS — J9690 Respiratory failure, unspecified, unspecified whether with hypoxia or hypercapnia: Secondary | ICD-10-CM | POA: Diagnosis not present

## 2020-07-23 DIAGNOSIS — J9621 Acute and chronic respiratory failure with hypoxia: Secondary | ICD-10-CM | POA: Diagnosis not present

## 2020-08-18 DIAGNOSIS — J9690 Respiratory failure, unspecified, unspecified whether with hypoxia or hypercapnia: Secondary | ICD-10-CM | POA: Diagnosis not present

## 2020-08-22 DIAGNOSIS — J9621 Acute and chronic respiratory failure with hypoxia: Secondary | ICD-10-CM | POA: Diagnosis not present

## 2020-09-18 DIAGNOSIS — J9690 Respiratory failure, unspecified, unspecified whether with hypoxia or hypercapnia: Secondary | ICD-10-CM | POA: Diagnosis not present

## 2020-09-22 DIAGNOSIS — F3341 Major depressive disorder, recurrent, in partial remission: Secondary | ICD-10-CM | POA: Diagnosis not present

## 2020-09-22 DIAGNOSIS — F5101 Primary insomnia: Secondary | ICD-10-CM | POA: Diagnosis not present

## 2020-09-22 DIAGNOSIS — E1142 Type 2 diabetes mellitus with diabetic polyneuropathy: Secondary | ICD-10-CM | POA: Diagnosis not present

## 2020-09-22 DIAGNOSIS — N390 Urinary tract infection, site not specified: Secondary | ICD-10-CM | POA: Diagnosis not present

## 2020-09-22 DIAGNOSIS — E1140 Type 2 diabetes mellitus with diabetic neuropathy, unspecified: Secondary | ICD-10-CM | POA: Diagnosis not present

## 2020-09-22 DIAGNOSIS — E559 Vitamin D deficiency, unspecified: Secondary | ICD-10-CM | POA: Diagnosis not present

## 2020-09-22 DIAGNOSIS — J309 Allergic rhinitis, unspecified: Secondary | ICD-10-CM | POA: Diagnosis not present

## 2020-09-22 DIAGNOSIS — F419 Anxiety disorder, unspecified: Secondary | ICD-10-CM | POA: Diagnosis not present

## 2020-09-22 DIAGNOSIS — G4733 Obstructive sleep apnea (adult) (pediatric): Secondary | ICD-10-CM | POA: Diagnosis not present

## 2020-09-22 DIAGNOSIS — J9621 Acute and chronic respiratory failure with hypoxia: Secondary | ICD-10-CM | POA: Diagnosis not present

## 2020-10-18 DIAGNOSIS — J9690 Respiratory failure, unspecified, unspecified whether with hypoxia or hypercapnia: Secondary | ICD-10-CM | POA: Diagnosis not present

## 2020-10-22 DIAGNOSIS — J9621 Acute and chronic respiratory failure with hypoxia: Secondary | ICD-10-CM | POA: Diagnosis not present

## 2020-10-22 DIAGNOSIS — G4733 Obstructive sleep apnea (adult) (pediatric): Secondary | ICD-10-CM | POA: Diagnosis not present

## 2020-10-22 DIAGNOSIS — J449 Chronic obstructive pulmonary disease, unspecified: Secondary | ICD-10-CM | POA: Diagnosis not present

## 2020-10-22 DIAGNOSIS — E1142 Type 2 diabetes mellitus with diabetic polyneuropathy: Secondary | ICD-10-CM | POA: Diagnosis not present

## 2020-10-22 DIAGNOSIS — J45909 Unspecified asthma, uncomplicated: Secondary | ICD-10-CM | POA: Diagnosis not present

## 2020-10-22 DIAGNOSIS — E785 Hyperlipidemia, unspecified: Secondary | ICD-10-CM | POA: Diagnosis not present

## 2020-10-22 DIAGNOSIS — F5101 Primary insomnia: Secondary | ICD-10-CM | POA: Diagnosis not present

## 2020-10-22 DIAGNOSIS — F3341 Major depressive disorder, recurrent, in partial remission: Secondary | ICD-10-CM | POA: Diagnosis not present

## 2020-10-22 DIAGNOSIS — J309 Allergic rhinitis, unspecified: Secondary | ICD-10-CM | POA: Diagnosis not present

## 2020-10-22 DIAGNOSIS — E1140 Type 2 diabetes mellitus with diabetic neuropathy, unspecified: Secondary | ICD-10-CM | POA: Diagnosis not present

## 2020-10-22 DIAGNOSIS — M159 Polyosteoarthritis, unspecified: Secondary | ICD-10-CM | POA: Diagnosis not present

## 2020-10-22 DIAGNOSIS — F419 Anxiety disorder, unspecified: Secondary | ICD-10-CM | POA: Diagnosis not present

## 2020-10-22 DIAGNOSIS — I10 Essential (primary) hypertension: Secondary | ICD-10-CM | POA: Diagnosis not present

## 2020-11-18 DIAGNOSIS — J9690 Respiratory failure, unspecified, unspecified whether with hypoxia or hypercapnia: Secondary | ICD-10-CM | POA: Diagnosis not present

## 2020-11-22 DIAGNOSIS — J9621 Acute and chronic respiratory failure with hypoxia: Secondary | ICD-10-CM | POA: Diagnosis not present

## 2020-12-19 DIAGNOSIS — J9690 Respiratory failure, unspecified, unspecified whether with hypoxia or hypercapnia: Secondary | ICD-10-CM | POA: Diagnosis not present

## 2020-12-31 DIAGNOSIS — Z0000 Encounter for general adult medical examination without abnormal findings: Secondary | ICD-10-CM | POA: Diagnosis not present

## 2020-12-31 DIAGNOSIS — E785 Hyperlipidemia, unspecified: Secondary | ICD-10-CM | POA: Diagnosis not present

## 2020-12-31 DIAGNOSIS — Z9181 History of falling: Secondary | ICD-10-CM | POA: Diagnosis not present

## 2021-01-18 DIAGNOSIS — J9690 Respiratory failure, unspecified, unspecified whether with hypoxia or hypercapnia: Secondary | ICD-10-CM | POA: Diagnosis not present

## 2021-02-18 DIAGNOSIS — J9690 Respiratory failure, unspecified, unspecified whether with hypoxia or hypercapnia: Secondary | ICD-10-CM | POA: Diagnosis not present

## 2021-02-20 DIAGNOSIS — G4733 Obstructive sleep apnea (adult) (pediatric): Secondary | ICD-10-CM | POA: Diagnosis not present

## 2021-02-20 DIAGNOSIS — E1165 Type 2 diabetes mellitus with hyperglycemia: Secondary | ICD-10-CM | POA: Diagnosis not present

## 2021-02-20 DIAGNOSIS — J45998 Other asthma: Secondary | ICD-10-CM | POA: Diagnosis not present

## 2021-02-21 DIAGNOSIS — R069 Unspecified abnormalities of breathing: Secondary | ICD-10-CM | POA: Diagnosis not present

## 2021-02-21 DIAGNOSIS — I10 Essential (primary) hypertension: Secondary | ICD-10-CM | POA: Diagnosis not present

## 2021-02-21 DIAGNOSIS — R059 Cough, unspecified: Secondary | ICD-10-CM | POA: Diagnosis not present

## 2021-02-21 DIAGNOSIS — Z20822 Contact with and (suspected) exposure to covid-19: Secondary | ICD-10-CM | POA: Diagnosis not present

## 2021-02-27 DIAGNOSIS — M159 Polyosteoarthritis, unspecified: Secondary | ICD-10-CM | POA: Diagnosis not present

## 2021-02-27 DIAGNOSIS — J45909 Unspecified asthma, uncomplicated: Secondary | ICD-10-CM | POA: Diagnosis not present

## 2021-02-27 DIAGNOSIS — E1140 Type 2 diabetes mellitus with diabetic neuropathy, unspecified: Secondary | ICD-10-CM | POA: Diagnosis not present

## 2021-02-27 DIAGNOSIS — J309 Allergic rhinitis, unspecified: Secondary | ICD-10-CM | POA: Diagnosis not present

## 2021-02-27 DIAGNOSIS — M545 Low back pain, unspecified: Secondary | ICD-10-CM | POA: Diagnosis not present

## 2021-02-27 DIAGNOSIS — Z9989 Dependence on other enabling machines and devices: Secondary | ICD-10-CM | POA: Diagnosis not present

## 2021-02-27 DIAGNOSIS — E559 Vitamin D deficiency, unspecified: Secondary | ICD-10-CM | POA: Diagnosis not present

## 2021-02-27 DIAGNOSIS — G4733 Obstructive sleep apnea (adult) (pediatric): Secondary | ICD-10-CM | POA: Diagnosis not present

## 2021-02-28 DIAGNOSIS — Z880 Allergy status to penicillin: Secondary | ICD-10-CM | POA: Diagnosis not present

## 2021-02-28 DIAGNOSIS — J9601 Acute respiratory failure with hypoxia: Secondary | ICD-10-CM | POA: Diagnosis not present

## 2021-02-28 DIAGNOSIS — I252 Old myocardial infarction: Secondary | ICD-10-CM | POA: Diagnosis not present

## 2021-02-28 DIAGNOSIS — A419 Sepsis, unspecified organism: Secondary | ICD-10-CM | POA: Diagnosis not present

## 2021-02-28 DIAGNOSIS — R0902 Hypoxemia: Secondary | ICD-10-CM | POA: Diagnosis not present

## 2021-02-28 DIAGNOSIS — Z91040 Latex allergy status: Secondary | ICD-10-CM | POA: Diagnosis not present

## 2021-02-28 DIAGNOSIS — Z7984 Long term (current) use of oral hypoglycemic drugs: Secondary | ICD-10-CM | POA: Diagnosis not present

## 2021-02-28 DIAGNOSIS — Z23 Encounter for immunization: Secondary | ICD-10-CM | POA: Diagnosis not present

## 2021-02-28 DIAGNOSIS — Z743 Need for continuous supervision: Secondary | ICD-10-CM | POA: Diagnosis not present

## 2021-02-28 DIAGNOSIS — R739 Hyperglycemia, unspecified: Secondary | ICD-10-CM | POA: Diagnosis not present

## 2021-02-28 DIAGNOSIS — J9621 Acute and chronic respiratory failure with hypoxia: Secondary | ICD-10-CM | POA: Diagnosis not present

## 2021-02-28 DIAGNOSIS — E872 Acidosis, unspecified: Secondary | ICD-10-CM | POA: Diagnosis not present

## 2021-02-28 DIAGNOSIS — R6889 Other general symptoms and signs: Secondary | ICD-10-CM | POA: Diagnosis not present

## 2021-02-28 DIAGNOSIS — Z87891 Personal history of nicotine dependence: Secondary | ICD-10-CM | POA: Diagnosis not present

## 2021-02-28 DIAGNOSIS — J441 Chronic obstructive pulmonary disease with (acute) exacerbation: Secondary | ICD-10-CM | POA: Diagnosis not present

## 2021-02-28 DIAGNOSIS — E876 Hypokalemia: Secondary | ICD-10-CM | POA: Diagnosis not present

## 2021-02-28 DIAGNOSIS — I428 Other cardiomyopathies: Secondary | ICD-10-CM | POA: Diagnosis not present

## 2021-02-28 DIAGNOSIS — I499 Cardiac arrhythmia, unspecified: Secondary | ICD-10-CM | POA: Diagnosis not present

## 2021-02-28 DIAGNOSIS — D649 Anemia, unspecified: Secondary | ICD-10-CM | POA: Diagnosis not present

## 2021-02-28 DIAGNOSIS — E8809 Other disorders of plasma-protein metabolism, not elsewhere classified: Secondary | ICD-10-CM | POA: Diagnosis not present

## 2021-02-28 DIAGNOSIS — K219 Gastro-esophageal reflux disease without esophagitis: Secondary | ICD-10-CM | POA: Diagnosis not present

## 2021-02-28 DIAGNOSIS — R079 Chest pain, unspecified: Secondary | ICD-10-CM | POA: Diagnosis not present

## 2021-02-28 DIAGNOSIS — J440 Chronic obstructive pulmonary disease with acute lower respiratory infection: Secondary | ICD-10-CM | POA: Diagnosis not present

## 2021-02-28 DIAGNOSIS — E1149 Type 2 diabetes mellitus with other diabetic neurological complication: Secondary | ICD-10-CM | POA: Diagnosis not present

## 2021-02-28 DIAGNOSIS — Z888 Allergy status to other drugs, medicaments and biological substances status: Secondary | ICD-10-CM | POA: Diagnosis not present

## 2021-02-28 DIAGNOSIS — Z79899 Other long term (current) drug therapy: Secondary | ICD-10-CM | POA: Diagnosis not present

## 2021-02-28 DIAGNOSIS — I10 Essential (primary) hypertension: Secondary | ICD-10-CM | POA: Diagnosis not present

## 2021-02-28 DIAGNOSIS — M1990 Unspecified osteoarthritis, unspecified site: Secondary | ICD-10-CM | POA: Diagnosis not present

## 2021-03-03 DIAGNOSIS — I428 Other cardiomyopathies: Secondary | ICD-10-CM

## 2021-03-16 DIAGNOSIS — E559 Vitamin D deficiency, unspecified: Secondary | ICD-10-CM | POA: Diagnosis not present

## 2021-03-16 DIAGNOSIS — M159 Polyosteoarthritis, unspecified: Secondary | ICD-10-CM | POA: Diagnosis not present

## 2021-03-16 DIAGNOSIS — G4733 Obstructive sleep apnea (adult) (pediatric): Secondary | ICD-10-CM | POA: Diagnosis not present

## 2021-03-16 DIAGNOSIS — J309 Allergic rhinitis, unspecified: Secondary | ICD-10-CM | POA: Diagnosis not present

## 2021-03-16 DIAGNOSIS — E1140 Type 2 diabetes mellitus with diabetic neuropathy, unspecified: Secondary | ICD-10-CM | POA: Diagnosis not present

## 2021-03-16 DIAGNOSIS — M545 Low back pain, unspecified: Secondary | ICD-10-CM | POA: Diagnosis not present

## 2021-03-16 DIAGNOSIS — I10 Essential (primary) hypertension: Secondary | ICD-10-CM | POA: Diagnosis not present

## 2021-03-16 DIAGNOSIS — E1142 Type 2 diabetes mellitus with diabetic polyneuropathy: Secondary | ICD-10-CM | POA: Diagnosis not present

## 2021-03-16 DIAGNOSIS — J45909 Unspecified asthma, uncomplicated: Secondary | ICD-10-CM | POA: Diagnosis not present

## 2021-03-16 DIAGNOSIS — Z9989 Dependence on other enabling machines and devices: Secondary | ICD-10-CM | POA: Diagnosis not present

## 2021-03-17 ENCOUNTER — Other Ambulatory Visit: Payer: Self-pay | Admitting: *Deleted

## 2021-03-17 NOTE — Patient Outreach (Signed)
[facility]) Care Management  03/17/2021  Peggy Ferguson 1956-01-02 [id number]  Referral Receive 11/29 Telephone Assessment-Unsuccessful Transition of care to be completed by the provider  RN attempted outreach call today however unsuccessful. RN able to leave a HIPAA approved voice message requesting a call back.  Will send outreach letter and attempt another outreach call over the next week for pending [facility] services.  Elliot Cousin, RN Care Management Coordinator [facility] Main Office [phone]

## 2021-03-20 DIAGNOSIS — J9690 Respiratory failure, unspecified, unspecified whether with hypoxia or hypercapnia: Secondary | ICD-10-CM | POA: Diagnosis not present

## 2021-03-23 ENCOUNTER — Other Ambulatory Visit: Payer: Self-pay | Admitting: *Deleted

## 2021-03-23 NOTE — Patient Outreach (Signed)
[facility]) Care Management  03/23/2021  Sharhonda Atwood 01/31/1956 [id number]   Telephone Assessment-Unsuccessful Outreach #2  RN attempted outreach call today however unsuccessful. RN able to leave a HIPAA approved voice message requesting a call back.  Will attempt another outreach call over the next week for pending [facility] services.  Elliot Cousin, RN Care Management Coordinator [facility] Main Office [phone]

## 2021-03-26 ENCOUNTER — Other Ambulatory Visit: Payer: Self-pay | Admitting: *Deleted

## 2021-03-26 NOTE — Patient Outreach (Signed)
[facility]) Care Management  03/26/2021  Peggy Ferguson 1955-12-18 [id number]   Telephone Assessment-Unsuccessful Outreach #3  RN attempted once again to reach this pt for [facility] services however unsuccessful. RN able to leave a HIPAA approved voice message requesting a call back.  RN will attempt another outreach call ober the next month for pending services.  Elliot Cousin, RN Care Management Coordinator [facility] Main Office [phone]

## 2021-05-01 ENCOUNTER — Other Ambulatory Visit: Payer: Self-pay | Admitting: *Deleted

## 2021-05-01 NOTE — Patient Outreach (Signed)
[facility]) Care Management  05/01/2021  Geralynn Capri 1955-11-21 [id number]   Case Closure Outreach #4  RN attempted several outreach call however unsuccessful. RN able to leave a HIPAA approved voice message requesting a call back.  Will further engage if a call back is received however due no response to calls or outreach letter will close this case and notify the provider of pt's disposition with [facility] services.  Elliot Cousin, RN Care Management Coordinator [facility] Main Office [phone]

## 2021-05-05 DIAGNOSIS — J309 Allergic rhinitis, unspecified: Secondary | ICD-10-CM | POA: Diagnosis not present

## 2021-05-05 DIAGNOSIS — N1831 Chronic kidney disease, stage 3a: Secondary | ICD-10-CM | POA: Diagnosis not present

## 2021-05-05 DIAGNOSIS — G4733 Obstructive sleep apnea (adult) (pediatric): Secondary | ICD-10-CM | POA: Diagnosis not present

## 2021-05-05 DIAGNOSIS — E1140 Type 2 diabetes mellitus with diabetic neuropathy, unspecified: Secondary | ICD-10-CM | POA: Diagnosis not present

## 2021-05-05 DIAGNOSIS — M159 Polyosteoarthritis, unspecified: Secondary | ICD-10-CM | POA: Diagnosis not present

## 2021-05-05 DIAGNOSIS — Z6841 Body Mass Index (BMI) 40.0 and over, adult: Secondary | ICD-10-CM | POA: Diagnosis not present

## 2021-05-05 DIAGNOSIS — R7989 Other specified abnormal findings of blood chemistry: Secondary | ICD-10-CM | POA: Diagnosis not present

## 2021-05-05 DIAGNOSIS — F3341 Major depressive disorder, recurrent, in partial remission: Secondary | ICD-10-CM | POA: Diagnosis not present

## 2021-05-05 DIAGNOSIS — J45909 Unspecified asthma, uncomplicated: Secondary | ICD-10-CM | POA: Diagnosis not present

## 2021-05-05 DIAGNOSIS — J449 Chronic obstructive pulmonary disease, unspecified: Secondary | ICD-10-CM | POA: Diagnosis not present

## 2021-05-05 DIAGNOSIS — E785 Hyperlipidemia, unspecified: Secondary | ICD-10-CM | POA: Diagnosis not present

## 2021-05-05 DIAGNOSIS — E1142 Type 2 diabetes mellitus with diabetic polyneuropathy: Secondary | ICD-10-CM | POA: Diagnosis not present

## 2021-05-26 DIAGNOSIS — J45909 Unspecified asthma, uncomplicated: Secondary | ICD-10-CM | POA: Diagnosis not present

## 2021-05-26 DIAGNOSIS — E559 Vitamin D deficiency, unspecified: Secondary | ICD-10-CM | POA: Diagnosis not present

## 2021-05-26 DIAGNOSIS — F3341 Major depressive disorder, recurrent, in partial remission: Secondary | ICD-10-CM | POA: Diagnosis not present

## 2021-05-26 DIAGNOSIS — E1142 Type 2 diabetes mellitus with diabetic polyneuropathy: Secondary | ICD-10-CM | POA: Diagnosis not present

## 2021-05-26 DIAGNOSIS — J309 Allergic rhinitis, unspecified: Secondary | ICD-10-CM | POA: Diagnosis not present

## 2021-05-26 DIAGNOSIS — N1831 Chronic kidney disease, stage 3a: Secondary | ICD-10-CM | POA: Diagnosis not present

## 2021-05-26 DIAGNOSIS — M159 Polyosteoarthritis, unspecified: Secondary | ICD-10-CM | POA: Diagnosis not present

## 2021-05-26 DIAGNOSIS — E1140 Type 2 diabetes mellitus with diabetic neuropathy, unspecified: Secondary | ICD-10-CM | POA: Diagnosis not present

## 2021-05-26 DIAGNOSIS — G4733 Obstructive sleep apnea (adult) (pediatric): Secondary | ICD-10-CM | POA: Diagnosis not present
